# Patient Record
Sex: Male | Born: 1965 | Race: White | Hispanic: No | Smoking: Current every day smoker
Health system: Southern US, Community
[De-identification: ages and names within clinical notes are randomized; demographics above are authoritative.]

## PROBLEM LIST (undated history)

## (undated) DIAGNOSIS — Z9114 Patient's other noncompliance with medication regimen: Secondary | ICD-10-CM

## (undated) DIAGNOSIS — F419 Anxiety disorder, unspecified: Secondary | ICD-10-CM

## (undated) DIAGNOSIS — I219 Acute myocardial infarction, unspecified: Secondary | ICD-10-CM

## (undated) DIAGNOSIS — F329 Major depressive disorder, single episode, unspecified: Secondary | ICD-10-CM

## (undated) DIAGNOSIS — F1010 Alcohol abuse, uncomplicated: Secondary | ICD-10-CM

## (undated) DIAGNOSIS — F32A Depression, unspecified: Secondary | ICD-10-CM

## (undated) DIAGNOSIS — I2510 Atherosclerotic heart disease of native coronary artery without angina pectoris: Secondary | ICD-10-CM

## (undated) DIAGNOSIS — Z91148 Patient's other noncompliance with medication regimen for other reason: Secondary | ICD-10-CM

## (undated) DIAGNOSIS — I10 Essential (primary) hypertension: Secondary | ICD-10-CM

## (undated) HISTORY — PX: CHEST TUBE INSERTION: SHX231

## (undated) HISTORY — PX: EYE SURGERY: SHX253

---

## 2013-02-13 HISTORY — PX: CORONARY ANGIOPLASTY: SHX604

## 2014-01-11 ENCOUNTER — Encounter (HOSPITAL_COMMUNITY)
Admission: EM | Discharge status: Against Medical Advice | Payer: MEDICAID | Source: Home / Self Care | Attending: Cardiovascular Disease

## 2014-01-11 ENCOUNTER — Inpatient Hospital Stay (HOSPITAL_COMMUNITY)
Admission: EM | Admit: 2014-01-11 | Discharge: 2014-01-13 | DRG: 246 | Discharge status: Against Medical Advice | Payer: Medicaid Other | Attending: Cardiovascular Disease | Admitting: Cardiovascular Disease

## 2014-01-11 ENCOUNTER — Encounter (HOSPITAL_COMMUNITY): Payer: Self-pay | Admitting: *Deleted

## 2014-01-11 ENCOUNTER — Inpatient Hospital Stay (HOSPITAL_COMMUNITY): Payer: Medicaid Other

## 2014-01-11 DIAGNOSIS — R079 Chest pain, unspecified: Secondary | ICD-10-CM | POA: Diagnosis present

## 2014-01-11 DIAGNOSIS — F329 Major depressive disorder, single episode, unspecified: Secondary | ICD-10-CM | POA: Diagnosis present

## 2014-01-11 DIAGNOSIS — I25110 Atherosclerotic heart disease of native coronary artery with unstable angina pectoris: Secondary | ICD-10-CM

## 2014-01-11 DIAGNOSIS — I252 Old myocardial infarction: Secondary | ICD-10-CM

## 2014-01-11 DIAGNOSIS — T82867A Thrombosis of cardiac prosthetic devices, implants and grafts, initial encounter: Principal | ICD-10-CM | POA: Diagnosis present

## 2014-01-11 DIAGNOSIS — Z955 Presence of coronary angioplasty implant and graft: Secondary | ICD-10-CM | POA: Diagnosis not present

## 2014-01-11 DIAGNOSIS — F17210 Nicotine dependence, cigarettes, uncomplicated: Secondary | ICD-10-CM | POA: Diagnosis present

## 2014-01-11 DIAGNOSIS — I255 Ischemic cardiomyopathy: Secondary | ICD-10-CM

## 2014-01-11 DIAGNOSIS — F1290 Cannabis use, unspecified, uncomplicated: Secondary | ICD-10-CM | POA: Diagnosis present

## 2014-01-11 DIAGNOSIS — Z720 Tobacco use: Secondary | ICD-10-CM

## 2014-01-11 DIAGNOSIS — I213 ST elevation (STEMI) myocardial infarction of unspecified site: Secondary | ICD-10-CM | POA: Diagnosis present

## 2014-01-11 DIAGNOSIS — R739 Hyperglycemia, unspecified: Secondary | ICD-10-CM

## 2014-01-11 DIAGNOSIS — Z9114 Patient's other noncompliance with medication regimen: Secondary | ICD-10-CM | POA: Diagnosis present

## 2014-01-11 DIAGNOSIS — F419 Anxiety disorder, unspecified: Secondary | ICD-10-CM | POA: Diagnosis present

## 2014-01-11 DIAGNOSIS — F1010 Alcohol abuse, uncomplicated: Secondary | ICD-10-CM | POA: Diagnosis present

## 2014-01-11 DIAGNOSIS — I209 Angina pectoris, unspecified: Secondary | ICD-10-CM

## 2014-01-11 DIAGNOSIS — I10 Essential (primary) hypertension: Secondary | ICD-10-CM | POA: Diagnosis present

## 2014-01-11 DIAGNOSIS — Y838 Other surgical procedures as the cause of abnormal reaction of the patient, or of later complication, without mention of misadventure at the time of the procedure: Secondary | ICD-10-CM | POA: Diagnosis present

## 2014-01-11 DIAGNOSIS — I2119 ST elevation (STEMI) myocardial infarction involving other coronary artery of inferior wall: Secondary | ICD-10-CM | POA: Diagnosis present

## 2014-01-11 DIAGNOSIS — E785 Hyperlipidemia, unspecified: Secondary | ICD-10-CM

## 2014-01-11 DIAGNOSIS — I4519 Other right bundle-branch block: Secondary | ICD-10-CM | POA: Diagnosis present

## 2014-01-11 DIAGNOSIS — F10230 Alcohol dependence with withdrawal, uncomplicated: Secondary | ICD-10-CM

## 2014-01-11 DIAGNOSIS — I2111 ST elevation (STEMI) myocardial infarction involving right coronary artery: Secondary | ICD-10-CM

## 2014-01-11 DIAGNOSIS — I2510 Atherosclerotic heart disease of native coronary artery without angina pectoris: Secondary | ICD-10-CM

## 2014-01-11 DIAGNOSIS — F10930 Alcohol use, unspecified with withdrawal, uncomplicated: Secondary | ICD-10-CM

## 2014-01-11 HISTORY — DX: Essential (primary) hypertension: I10

## 2014-01-11 HISTORY — DX: Atherosclerotic heart disease of native coronary artery without angina pectoris: I25.10

## 2014-01-11 HISTORY — DX: Patient's other noncompliance with medication regimen: Z91.14

## 2014-01-11 HISTORY — DX: Depression, unspecified: F32.A

## 2014-01-11 HISTORY — DX: Acute myocardial infarction, unspecified: I21.9

## 2014-01-11 HISTORY — DX: Anxiety disorder, unspecified: F41.9

## 2014-01-11 HISTORY — PX: LEFT HEART CATHETERIZATION WITH CORONARY ANGIOGRAM: SHX5451

## 2014-01-11 HISTORY — DX: Patient's other noncompliance with medication regimen for other reason: Z91.148

## 2014-01-11 HISTORY — DX: Alcohol abuse, uncomplicated: F10.10

## 2014-01-11 HISTORY — DX: Major depressive disorder, single episode, unspecified: F32.9

## 2014-01-11 HISTORY — PX: CARDIAC CATHETERIZATION: SHX172

## 2014-01-11 LAB — HEMOGLOBIN A1C
Hgb A1c MFr Bld: 5.4 % (ref <5.7)
Mean Plasma Glucose: 108 mg/dL (ref <117)

## 2014-01-11 LAB — CBC WITH DIFFERENTIAL/PLATELET
Basophils Absolute: 0.1 10*3/uL (ref 0.0–0.1)
Basophils Relative: 1 % (ref 0–1)
EOS ABS: 0.3 10*3/uL (ref 0.0–0.7)
EOS PCT: 4 % (ref 0–5)
HCT: 46.3 % (ref 39.0–52.0)
HEMOGLOBIN: 15.8 g/dL (ref 13.0–17.0)
LYMPHS ABS: 1.4 10*3/uL (ref 0.7–4.0)
Lymphocytes Relative: 16 % (ref 12–46)
MCH: 31.9 pg (ref 26.0–34.0)
MCHC: 34.1 g/dL (ref 30.0–36.0)
MCV: 93.5 fL (ref 78.0–100.0)
Monocytes Absolute: 1 10*3/uL (ref 0.1–1.0)
Monocytes Relative: 11 % (ref 3–12)
Neutro Abs: 6.4 10*3/uL (ref 1.7–7.7)
Neutrophils Relative %: 68 % (ref 43–77)
PLATELETS: 686 10*3/uL — AB (ref 150–400)
RBC: 4.95 MIL/uL (ref 4.22–5.81)
RDW: 13.9 % (ref 11.5–15.5)
WBC: 9.3 10*3/uL (ref 4.0–10.5)

## 2014-01-11 LAB — CK TOTAL AND CKMB (NOT AT ARMC)
CK, MB: 2.9 ng/mL (ref 0.3–4.0)
Relative Index: 1.5 (ref 0.0–2.5)
Total CK: 194 U/L (ref 7–232)

## 2014-01-11 LAB — COMPREHENSIVE METABOLIC PANEL
ALK PHOS: 93 U/L (ref 39–117)
ALT: 81 U/L — AB (ref 0–53)
ANION GAP: 15 (ref 5–15)
AST: 77 U/L — ABNORMAL HIGH (ref 0–37)
Albumin: 4 g/dL (ref 3.5–5.2)
BILIRUBIN TOTAL: 0.3 mg/dL (ref 0.3–1.2)
BUN: 10 mg/dL (ref 6–23)
CHLORIDE: 102 meq/L (ref 96–112)
CO2: 23 mEq/L (ref 19–32)
Calcium: 8.8 mg/dL (ref 8.4–10.5)
Creatinine, Ser: 0.76 mg/dL (ref 0.50–1.35)
GFR calc non Af Amer: >90 mL/min (ref >90)
GLUCOSE: 184 mg/dL — AB (ref 70–99)
POTASSIUM: 3.9 meq/L (ref 3.7–5.3)
SODIUM: 140 meq/L (ref 137–147)
TOTAL PROTEIN: 7.3 g/dL (ref 6.0–8.3)

## 2014-01-11 LAB — RAPID URINE DRUG SCREEN, HOSP PERFORMED
Amphetamines: NOT DETECTED
Barbiturates: NOT DETECTED
Benzodiazepines: NOT DETECTED
Cocaine: NOT DETECTED
Opiates: POSITIVE — AB
TETRAHYDROCANNABINOL: NOT DETECTED

## 2014-01-11 LAB — POCT I-STAT, CHEM 8
BUN: 9 mg/dL (ref 6–23)
CHLORIDE: 103 meq/L (ref 96–112)
CREATININE: 0.7 mg/dL (ref 0.50–1.35)
Calcium, Ion: 1.24 mmol/L — ABNORMAL HIGH (ref 1.12–1.23)
GLUCOSE: 187 mg/dL — AB (ref 70–99)
HCT: 50 % (ref 39.0–52.0)
Hemoglobin: 17 g/dL (ref 13.0–17.0)
Potassium: 3.8 mEq/L (ref 3.7–5.3)
Sodium: 141 mEq/L (ref 137–147)
TCO2: 23 mmol/L (ref 0–100)

## 2014-01-11 LAB — TROPONIN I
Troponin I: <0.3 ng/mL (ref <0.30)
Troponin I: 8.09 ng/mL (ref <0.30)
Troponin I: >20 ng/mL (ref <0.30)

## 2014-01-11 LAB — POCT ACTIVATED CLOTTING TIME
Activated Clotting Time: 394 seconds
Activated Clotting Time: 123 seconds

## 2014-01-11 LAB — LIPID PANEL
CHOL/HDL RATIO: 4.1 ratio
CHOLESTEROL: 203 mg/dL — AB (ref 0–200)
HDL: 49 mg/dL (ref >39)
LDL CALC: 131 mg/dL — AB (ref 0–99)
Triglycerides: 116 mg/dL (ref <150)
VLDL: 23 mg/dL (ref 0–40)

## 2014-01-11 LAB — MRSA PCR SCREENING: MRSA BY PCR: NEGATIVE

## 2014-01-11 LAB — PROTIME-INR
INR: 1.16 (ref 0.00–1.49)
Prothrombin Time: 14.9 seconds (ref 11.6–15.2)

## 2014-01-11 LAB — APTT: APTT: 118 s — AB (ref 24–37)

## 2014-01-11 SURGERY — LEFT HEART CATHETERIZATION WITH CORONARY ANGIOGRAM
Anesthesia: LOCAL | Site: Groin | Laterality: Right

## 2014-01-11 MED ORDER — SODIUM CHLORIDE 0.9 % IV SOLN
INTRAVENOUS | Status: AC
  Administered 2014-01-11: 12:00:00 via INTRAVENOUS

## 2014-01-11 MED ORDER — LORAZEPAM 1 MG PO TABS
1.0000 mg | ORAL_TABLET | Freq: Four times a day (QID) | ORAL | Status: DC | PRN
  Administered 2014-01-12 – 2014-01-13 (×2): 1 mg via ORAL
  Filled 2014-01-11: qty 1

## 2014-01-11 MED ORDER — ACETAMINOPHEN 325 MG PO TABS: 650.0000 mg | ORAL_TABLET | ORAL | Status: DC | PRN

## 2014-01-11 MED ORDER — ASPIRIN 81 MG PO CHEW
81.0000 mg | CHEWABLE_TABLET | Freq: Every day | ORAL | Status: DC
  Administered 2014-01-12 – 2014-01-13 (×2): 81 mg via ORAL
  Filled 2014-01-11 (×2): qty 1

## 2014-01-11 MED ORDER — SODIUM CHLORIDE 0.9 % IV SOLN
1.0000 mg/kg/h | INTRAVENOUS | Status: AC
  Administered 2014-01-11: 1 mg/kg/h via INTRAVENOUS
  Filled 2014-01-11 (×3): qty 250

## 2014-01-11 MED ORDER — ONDANSETRON HCL 4 MG/2ML IJ SOLN: 4.0000 mg | Freq: Four times a day (QID) | INTRAMUSCULAR | Status: DC | PRN

## 2014-01-11 MED ORDER — LORAZEPAM 1 MG PO TABS: 0.0000 mg | ORAL_TABLET | Freq: Two times a day (BID) | ORAL | Status: DC

## 2014-01-11 MED ORDER — LORAZEPAM 1 MG PO TABS
0.0000 mg | ORAL_TABLET | Freq: Four times a day (QID) | ORAL | Status: AC
  Administered 2014-01-11 – 2014-01-12 (×3): 1 mg via ORAL
  Filled 2014-01-11 (×2): qty 1
  Filled 2014-01-11: qty 2
  Filled 2014-01-11: qty 1

## 2014-01-11 MED ORDER — MORPHINE SULFATE 2 MG/ML IJ SOLN
2.0000 mg | INTRAMUSCULAR | Status: DC | PRN
  Administered 2014-01-11 (×3): 2 mg via INTRAVENOUS
  Filled 2014-01-11 (×3): qty 1

## 2014-01-11 MED ORDER — ATORVASTATIN CALCIUM 40 MG PO TABS
40.0000 mg | ORAL_TABLET | Freq: Every day | ORAL | Status: DC
  Administered 2014-01-11 – 2014-01-12 (×2): 40 mg via ORAL
  Filled 2014-01-11 (×3): qty 1

## 2014-01-11 MED ORDER — METOPROLOL TARTRATE 12.5 MG HALF TABLET
12.5000 mg | ORAL_TABLET | Freq: Two times a day (BID) | ORAL | Status: DC
  Administered 2014-01-11 – 2014-01-13 (×5): 12.5 mg via ORAL
  Filled 2014-01-11 (×6): qty 1

## 2014-01-11 MED ORDER — THIAMINE HCL 100 MG/ML IJ SOLN
100.0000 mg | Freq: Every day | INTRAMUSCULAR | Status: DC
  Filled 2014-01-11 (×2): qty 1

## 2014-01-11 MED ORDER — FOLIC ACID 1 MG PO TABS
1.0000 mg | ORAL_TABLET | Freq: Every day | ORAL | Status: DC
  Administered 2014-01-11 – 2014-01-13 (×3): 1 mg via ORAL
  Filled 2014-01-11 (×3): qty 1

## 2014-01-11 MED ORDER — ADULT MULTIVITAMIN W/MINERALS CH
1.0000 | ORAL_TABLET | Freq: Every day | ORAL | Status: DC
  Administered 2014-01-11 – 2014-01-13 (×3): 1 via ORAL
  Filled 2014-01-11 (×3): qty 1

## 2014-01-11 MED ORDER — TIROFIBAN HCL IV 5 MG/100ML
0.1500 ug/kg/min | INTRAVENOUS | Status: AC
  Administered 2014-01-11 – 2014-01-12 (×3): 0.15 ug/kg/min via INTRAVENOUS
  Filled 2014-01-11 (×6): qty 100

## 2014-01-11 MED ORDER — CLOPIDOGREL BISULFATE 75 MG PO TABS
75.0000 mg | ORAL_TABLET | Freq: Every day | ORAL | Status: DC
  Administered 2014-01-12 – 2014-01-13 (×2): 75 mg via ORAL
  Filled 2014-01-11 (×3): qty 1

## 2014-01-11 MED ORDER — VITAMIN B-1 100 MG PO TABS
100.0000 mg | ORAL_TABLET | Freq: Every day | ORAL | Status: DC
  Administered 2014-01-11 – 2014-01-13 (×3): 100 mg via ORAL
  Filled 2014-01-11 (×3): qty 1

## 2014-01-11 MED ORDER — ATROPINE SULFATE 0.1 MG/ML IJ SOLN
INTRAMUSCULAR | Status: AC
  Filled 2014-01-11: qty 10

## 2014-01-11 MED ORDER — SODIUM CHLORIDE 0.9 % IV SOLN
1.7500 mg/kg/h | INTRAVENOUS | Status: DC
  Filled 2014-01-11 (×3): qty 250

## 2014-01-11 MED ORDER — LORAZEPAM 2 MG/ML IJ SOLN
1.0000 mg | Freq: Four times a day (QID) | INTRAMUSCULAR | Status: DC | PRN
  Administered 2014-01-12: 1 mg via INTRAVENOUS
  Filled 2014-01-11: qty 1
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
## 2014-01-11 NOTE — Care Management Note (Addendum)
    Page 1 of 1   01/11/2014     2:59:39 PM CARE MANAGEMENT NOTE 01/11/2014  Patient:  RUE, TINNEL   Account Number:  [id number]  Date Initiated:  01/11/2014  Documentation initiated by:  Elissa Hefty  Subjective/Objective Assessment:   adm w mi     Action/Plan:   lives w fam   Anticipated DC Date:     Anticipated DC Plan:        [place]  CM consult  [facility]      Choice offered to / List presented to:             Status of service:   Medicare Important Message given?   (If response is "NO", the following Medicare IM given date fields will be blank) Date Medicare IM given:   Medicare IM given by:   Date Additional Medicare IM given:   Additional Medicare IM given by:    Discharge Disposition:    Per UR Regulation:  Reviewed for med. necessity/level of care/duration of stay  If discussed at [place] of Stay Meetings, dates discussed:    Comments:  10/22 1457 debbie Dannie Hattabaugh rn,bsn spoke w pt and gave him inform on [facility] in [place]. pt states he knows of clinic but feels copay and meds there would be too high. gave pt 2 prescription discount cards but pt states he has tried this before w no success. md may want to get on as many 4.00 meds as possible. did leave inform for pt.

## 2014-01-11 NOTE — Progress Notes (Signed)
01/11/2014 1800  Rt arterial sheath removed. Pressure held x 25 minutes. Site remains same as before removal. Groin site level 2. Pt remained stable.  Instructions for groin site given. Freq reminders given.  Ayn Domangue, Carolynn Comment

## 2014-01-11 NOTE — Progress Notes (Signed)
01/11/2014 2:18 PM Trop 8.09 given to [employer] PA. Tyneshia Stivers, Carolynn Comment

## 2014-01-11 NOTE — Progress Notes (Signed)
1400  Rt groin site oozing around sheath. Dssg changed. Rt groin hard and swollen. Charge nurse called to access along with this nurse. Pressure held around site. Instructed again not to raise head off of bed or to bend at groin. Cath lab called. Lenon Kuennen, Carolynn Comment

## 2014-01-11 NOTE — Progress Notes (Signed)
Chaplain responded to code stemi page. No family present. Please page chaplain if needed. Will follow as needed.  Joshua Bird 01/11/2014 Now

## 2014-01-11 NOTE — CV Procedure (Signed)
Joshua Bird is a 48 y.o. male    [id number] LOCATION:  FACILITY: [facility]  PHYSICIAN: Quay Burow, M.D. 1965/06/10   DATE OF PROCEDURE:  01/11/2014  DATE OF DISCHARGE:     CARDIAC CATHETERIZATION     History obtained from chart review.Joshua Bird is a 48 year old Caucasian male with history of CAD status post stenting in [place] one year ago. He has a history of tobacco abuse and medication noncompliance because of fiscal constraints. He stopped taking his medications approximately 6 months ago.. Acute onset of substernal chest pain an approximate 7:00 this morning and was brought by EMS to [name] room where he was noted to have inferior ST segment elevation. He was brought emergently to the cath lab for angiography and intervention.   PROCEDURE DESCRIPTION:   The patient was brought to the second floor [facility] cath lab in the postabsorptive state. He was premedicated with Valium 5 mg by mouth, IV Versed and fentanyl. His right groinwas prepped and shaved in usual sterile fashion. Xylocaine 1% was used for local anesthesia. A 6 French sheath was inserted into the right common femoral artery using standard Seldinger technique. 6 French right and left Judkins diagnostic catheters as well as a 6 French pigtail catheter were used for selective coronary angiography and left ventriculography respectively. Visipaque dye was used for the entirety of the case. Retrograde aortic, left ventricular and pullback pressures were recorded.   HEMODYNAMICS:    AO SYSTOLIC/AO DIASTOLIC: 616/07   LV SYSTOLIC/LV DIASTOLIC: 371/06  ANGIOGRAPHIC RESULTS:   1. Left main; normal  2. LAD; normal 3. Left circumflex; nondominant and normal. There was a moderately large ramus branch that was significant disease.  4. Right coronary artery; large dominant vessel that was the infarct-related artery. There was thrombus in the proximal and mid stented segment followed by total  occlusion at the genu. There was grade 1 left right collaterals. 5. Left ventriculography; RAO left ventriculogram was performed using  25 mL of Visipaque dye at 12 mL/second. The overall LVEF estimated  40-45 %  With wall motion abnormalities mild to moderate distal anteroapical and inferoapical hypokinesia.  IMPRESSION:Joshua Bird has an occluded dominant RCA with an internist and he is a large thrombus burden. We will proceed with PCI and stenting of aspiration thrombectomy.  Procedure description: The patient received Angiomax bolus and ACT of 394. He received Aggrastat bolus and infusion as well. He was 600 mg of by mouth Plavix. A total of 200 cc of contrast was administered during the case. Retrograde aortic pressure was monitored during the case. A 6 [place] JR 4 guide catheter along with an 014/190 cm long guidewire and a 2 mm x 12 mm long Trek balloon PTCA was performed.  Following this aspiration thrombectomy was performed daily with a Fetch 2 aspiration catheter.multiple passes were performed removing a copious amount of thrombotic material. Following this balloon dilatation was performed with a 3 mm x 20 mm long balloon establishing antegrade flow .The DTBT  time was 22 minutes. I then performed IVUS revealing a proximal and distal reference segment measuring approximately 6 mm. Following this I deployed a 5 mm x 24 mm long bare-metal stent across the stenotic area and deployed at 16 atmospheres. I post dilated with a 6 mm x 15 mm balloon. The final angiographic result introduction of a total occlusion to 0% residual with TIMI-3 flow. Because of filling defect in the mid stented segment despite multiple passes with an aspiration catheter  I elected to stent the mid RCA with a 3.5 millimeter by 23 mm long Penta stent at 17 atmospheres (3.95 mm) resulting in reduction of the visible thrombus with excellent flow.   Final impression: Successful PCI and stenting of a thrombotic lesion occluded large  dominant RCA with bare-metal stents, aspirin, Plavix, Angiomax and Aggrastat. I performed aspiration thrombectomy as well as intravascular ultrasound. The patient did have moderate LV dysfunction. Aggressive medical therapy will be initiated and social service consult will be obtained to ensure medication availability and compliance. The patient left the lab in stable condition. He'll probably be stable for discharge in 48-72 hours.   Lorretta Harp MD, [facility] 01/11/2014 11:12 AM

## 2014-01-12 DIAGNOSIS — I2111 ST elevation (STEMI) myocardial infarction involving right coronary artery: Secondary | ICD-10-CM

## 2014-01-12 DIAGNOSIS — F10230 Alcohol dependence with withdrawal, uncomplicated: Secondary | ICD-10-CM

## 2014-01-12 DIAGNOSIS — F1010 Alcohol abuse, uncomplicated: Secondary | ICD-10-CM

## 2014-01-12 DIAGNOSIS — I10 Essential (primary) hypertension: Secondary | ICD-10-CM

## 2014-01-12 DIAGNOSIS — Z720 Tobacco use: Secondary | ICD-10-CM

## 2014-01-12 DIAGNOSIS — I25110 Atherosclerotic heart disease of native coronary artery with unstable angina pectoris: Secondary | ICD-10-CM

## 2014-01-12 DIAGNOSIS — I209 Angina pectoris, unspecified: Secondary | ICD-10-CM

## 2014-01-12 LAB — HEPATIC FUNCTION PANEL
ALK PHOS: 92 U/L (ref 39–117)
ALT: 83 U/L — AB (ref 0–53)
AST: 167 U/L — AB (ref 0–37)
Albumin: 3.9 g/dL (ref 3.5–5.2)
Bilirubin, Direct: <0.2 mg/dL (ref 0.0–0.3)
TOTAL PROTEIN: 7.1 g/dL (ref 6.0–8.3)
Total Bilirubin: 0.7 mg/dL (ref 0.3–1.2)

## 2014-01-12 LAB — TROPONIN I: Troponin I: >20 ng/mL (ref <0.30)

## 2014-01-12 LAB — BASIC METABOLIC PANEL
Anion gap: 15 (ref 5–15)
BUN: 8 mg/dL (ref 6–23)
CHLORIDE: 98 meq/L (ref 96–112)
CO2: 24 mEq/L (ref 19–32)
Calcium: 9.1 mg/dL (ref 8.4–10.5)
Creatinine, Ser: 0.78 mg/dL (ref 0.50–1.35)
GFR calc Af Amer: >90 mL/min (ref >90)
GLUCOSE: 102 mg/dL — AB (ref 70–99)
POTASSIUM: 4 meq/L (ref 3.7–5.3)
SODIUM: 137 meq/L (ref 137–147)

## 2014-01-12 LAB — CBC
HCT: 45.6 % (ref 39.0–52.0)
HEMOGLOBIN: 15.2 g/dL (ref 13.0–17.0)
MCH: 31.4 pg (ref 26.0–34.0)
MCHC: 33.3 g/dL (ref 30.0–36.0)
MCV: 94.2 fL (ref 78.0–100.0)
Platelets: 606 10*3/uL — ABNORMAL HIGH (ref 150–400)
RBC: 4.84 MIL/uL (ref 4.22–5.81)
RDW: 13.8 % (ref 11.5–15.5)
WBC: 11.3 10*3/uL — AB (ref 4.0–10.5)

## 2014-01-12 MED ORDER — HYDROCODONE-ACETAMINOPHEN 5-325 MG PO TABS
1.0000 | ORAL_TABLET | ORAL | Status: DC | PRN
  Administered 2014-01-12 – 2014-01-13 (×2): 1 via ORAL
  Filled 2014-01-12 (×2): qty 1

## 2014-01-12 MED FILL — Heparin Sodium (Porcine) 2 Unit/ML in Sodium Chloride 0.9%: INTRAMUSCULAR | Qty: 1000 | Status: AC

## 2014-01-12 MED FILL — Nitroglycerin IV Soln 100 MCG/ML in D5W: INTRA_ARTERIAL | Qty: 10 | Status: AC

## 2014-01-12 MED FILL — Sodium Chloride IV Soln 0.9%: INTRAVENOUS | Qty: 50 | Status: AC

## 2014-01-12 MED FILL — Bivalirudin For IV Soln 250 MG: INTRAVENOUS | Qty: 250 | Status: AC

## 2014-01-12 MED FILL — Ondansetron HCl Inj 4 MG/2ML (2 MG/ML): INTRAMUSCULAR | Qty: 2 | Status: AC

## 2014-01-12 MED FILL — Heparin Sodium (Porcine) Inj 1000 Unit/ML: INTRAMUSCULAR | Qty: 10 | Status: AC

## 2014-01-12 MED FILL — Lidocaine HCl Local Preservative Free (PF) Inj 1%: INTRAMUSCULAR | Qty: 30 | Status: AC

## 2014-01-12 MED FILL — Tirofiban HCl in NaCl IV Soln 25 MG/500ML (Base Equiv): INTRAVENOUS | Qty: 100 | Status: AC

## 2014-01-12 NOTE — Progress Notes (Signed)
PROGRESS NOTE  Subjective:   48 yo male, hx of ETOH abuse, HTN Stopped taking plavix   Objective:    Vital Signs:   Temp:  [97.3 F (36.3 C)-98.6 F (37 C)] 98 F (36.7 C) (10/23 0700) Pulse Rate:  [57-94] 94 (10/23 0900) Resp:  [12-27] 22 (10/23 0900) BP: (114-158)/(73-112) 136/91 mmHg (10/23 0700) SpO2:  [88 %-99 %] 93 % (10/23 0900) Weight:  [188 lb 7.9 oz (85.5 kg)-200 lb 9.9 oz (91 kg)] 188 lb 7.9 oz (85.5 kg) (10/22 1200)  Last BM Date: 01/11/14   24-hour weight change: Weight change:   Weight trends: Filed Weights   01/11/14 1024 01/11/14 1138 01/11/14 1200  Weight: 200 lb 9.9 oz (91 kg) 189 lb 2.5 oz (85.8 kg) 188 lb 7.9 oz (85.5 kg)    Intake/Output:  10/22 0701 - 10/23 0700 In: 2476.8 [P.O.:1080; I.V.:1396.8] Out: 2725 [Urine:2725] Total I/O In: 240 [P.O.:240] Out: -    Physical Exam: BP 136/91  Pulse 94  Temp(Src) 98 F (36.7 C) (Oral)  Resp 22  Ht 5\' 6"  (1.676 m)  Wt 188 lb 7.9 oz (85.5 kg)  BMI 30.44 kg/m2  SpO2 93%  Wt Readings from Last 3 Encounters:  01/11/14 188 lb 7.9 oz (85.5 kg)  01/11/14 188 lb 7.9 oz (85.5 kg)    General: Vital signs reviewed and noted.   Head: Normocephalic, atraumatic.  Eyes: conjunctivae/corneas clear.  EOM's intact.   Throat: normal  Neck:  normal   Lungs:    clear   Heart:  RR  Abdomen:  Soft, non-tender, non-distended    Extremities: r femoral cath site looks ok.  Mild echymosis, no hematoma    Neurologic: A&O X3, CN II - XII are grossly intact.   Psych: Normal     Labs: BMET:  Recent Labs  01/11/14 0944 01/11/14 0946 01/12/14 0220  NA 140 141 137  K 3.9 3.8 4.0  CL 102 103 98  CO2 23  --  24  GLUCOSE 184* 187* 102*  BUN 10 9 8   CREATININE 0.76 0.70 0.78  CALCIUM 8.8  --  9.1    Liver function tests:  Recent Labs  01/11/14 0944 01/12/14 0220  AST 77* 167*  ALT 81* 83*  ALKPHOS 93 92  BILITOT 0.3 0.7  PROT 7.3 7.1  ALBUMIN 4.0 3.9   No results found for this  basename: LIPASE, AMYLASE,  in the last 72 hours  CBC:  Recent Labs  01/11/14 0944 01/11/14 0946 01/12/14 0220  WBC 9.3  --  11.3*  NEUTROABS 6.4  --   --   HGB 15.8 17.0 15.2  HCT 46.3 50.0 45.6  MCV 93.5  --  94.2  PLT 686*  --  606*    Cardiac Enzymes:  Recent Labs  01/11/14 0944 01/11/14 1129 01/11/14 1600 01/12/14 0656  CKTOTAL 194  --   --   --   CKMB 2.9  --   --   --   TROPONINI <0.30 8.09* >20.00* >20.00*    Coagulation Studies:  Recent Labs  01/11/14 0944  LABPROT 14.9  INR 1.16    Other: No components found with this basename: POCBNP,  No results found for this basename: DDIMER,  in the last 72 hours  Recent Labs  01/11/14 0944  HGBA1C 5.4    Recent Labs  01/11/14 0944  CHOL 203*  HDL 49  LDLCALC 131*  TRIG 116  CHOLHDL 4.1   No results  found for this basename: TSH, T4TOTAL, FREET3, T3FREE, THYROIDAB,  in the last 72 hours No results found for this basename: VITAMINB12, FOLATE, FERRITIN, TIBC, IRON, RETICCTPCT,  in the last 72 hours   Other results:  EKG :   NSR,  Persistent ST elevation inferior.   Medications:    Infusions:    Scheduled Medications: . aspirin  81 mg Oral Daily  . atorvastatin  40 mg Oral q1800  . clopidogrel  75 mg Oral Q breakfast  . folic acid  1 mg Oral Daily  . LORazepam  0-4 mg Oral Q6H   Followed by  . [START ON 01/13/2014] LORazepam  0-4 mg Oral Q12H  . metoprolol tartrate  12.5 mg Oral BID  . multivitamin with minerals  1 tablet Oral Daily  . thiamine  100 mg Oral Daily   Or  . thiamine  100 mg Intravenous Daily    Assessment/ Plan:      STEMI (ST elevation myocardial infarction) Due to thrombosis of his RCA stent.  He had stopped his plavix Advised him on the importance of taking that medication and all medication.s    CAD (coronary artery disease)    HTN (hypertension) - BP is stable     Hyperlipidemia    Tobacco abuse - advised him to stop smoking    Alcohol abuse - on the CIWA  protocol     Cardiomyopathy, ischemic    Hyperglycemia  Transfer to tele  Disposition:  Length of Stay: 1  Thayer Headings, Brooke Bonito., MD, [facility] 01/12/2014, 9:14 AM Office [phone] Pager [phone]

## 2014-01-12 NOTE — Progress Notes (Signed)
Pt c/o groin pain, he has not been compliant with leg restriction. Hematoma increased in size when re-assessed, aprox 2cm. Pressure held for 15 minutes and hematoma massaged. Re-educated with pt the importance of mobility restriction and bedrest extended until the am.

## 2014-01-12 NOTE — Progress Notes (Signed)
CARDIAC REHAB PHASE I   PRE:  Rate/Rhythm: 84 SR    BP: sitting 101/74    SaO2: 97 RA  MODE:  Ambulation: 450 ft   POST:  Rate/Rhythm: 97 SR    BP: sitting 110/74     SaO2: 97 RA  Pt very difficult to wake up. Reluctant to walk. Unsteady getting up and walking. He sts this is normal for him due to ankles, knees, back and hip pain. Also somewhat SOB which he sts is his norm. Pt sts he has an appt tomorrow afternoon with social services and has waited for this appt 3 months. Wants to make it to it. Did ed, some receptiveness but also watching tv. Pt sts he is not sure he wants to quit smoking. Gave resources. Declined ex gl.  [place], [place], ACSM 01/12/2014 3:39 PM

## 2014-01-12 NOTE — Progress Notes (Signed)
EKG CRITICAL VALUE     12 lead EKG performed.  Critical value noted.  Chauncey Reading, RN notified.   Sahil Milner C, CCT 01/12/2014 7:06 AM

## 2014-01-13 MED ORDER — CLOPIDOGREL BISULFATE 75 MG PO TABS: 75.0000 mg | ORAL_TABLET | Freq: Every day | ORAL | Status: DC

## 2014-01-13 MED ORDER — METOPROLOL TARTRATE 25 MG PO TABS: 12.5000 mg | ORAL_TABLET | Freq: Two times a day (BID) | ORAL | Status: DC

## 2014-01-13 MED ORDER — THIAMINE HCL 100 MG PO TABS: 100.0000 mg | ORAL_TABLET | Freq: Every day | ORAL | Status: DC

## 2014-01-13 MED ORDER — ASPIRIN 81 MG PO CHEW
81.0000 mg | CHEWABLE_TABLET | Freq: Every day | ORAL | Status: DC
Start: 2014-01-13 — End: 2015-08-09

## 2014-01-13 MED ORDER — FOLIC ACID 1 MG PO TABS: 1.0000 mg | ORAL_TABLET | Freq: Every day | ORAL | Status: DC

## 2014-01-13 MED ORDER — ATORVASTATIN CALCIUM 40 MG PO TABS: 40.0000 mg | ORAL_TABLET | Freq: Every day | ORAL | Status: DC

## 2014-01-13 MED ORDER — ADULT MULTIVITAMIN W/MINERALS CH: 1.0000 | ORAL_TABLET | Freq: Every day | ORAL | Status: DC

## 2014-01-13 NOTE — Progress Notes (Signed)
I was called by the nurse stating the patient left AMA.  I will complete medical reconciliation.  Alroy Portela, PAC

## 2014-01-13 NOTE — Progress Notes (Signed)
CARDIAC REHAB PHASE I   PRE:  Rate/Rhythm:   BP:  Supine:   Sitting: 96/54  Standing:    SaO2: 94% RA  MODE:  Ambulation: 550 ft   POST:  Rate/Rhythm: 81  BP:  Supine:   Sitting: 90/62  Standing:    SaO2: 96% RA  [id number] Patient tolerated ambulation well, walking independently, gait fast, steady, mild SOB, orthopedic pain: hips, back, groin with ambulation. Pt denies CP. Blood pressure low before and after ambulation, asymptomatic. To chair after walk.  Sol Passer, MS, ACSM CCEP

## 2014-01-13 NOTE — Discharge Summary (Signed)
Physician Discharge Summary     Cardiologist: Gwenlyn Found Patient ID: Joshua Bird MRN: [id number] DOB/AGE: 1965/10/29 48 y.o.  Admit date: 01/11/2014 Discharge date: 01/13/2014  Admission Diagnoses: STEMI  Discharge Diagnoses:  Principal Problem:   STEMI (ST elevation myocardial infarction) Active Problems:   CAD (coronary artery disease)   HTN (hypertension)   Hyperlipidemia   Tobacco abuse   Alcohol abuse   Cardiomyopathy, ischemic   Hyperglycemia   Discharged Condition: The patient was feeling better the day he left [place] Hospital Course:   Joshua Bird is a 48 y/o M with ongoing tobacco abuse and alcohol abuse, HTN, and reported history of CAD s/p stenting in [place] in 2014 who presented to [facility] today with chest pain. He has history of medication noncompliance because of fiscal constraints. He stopped taking his medications approximately 6 months ago. He developed acute onset of substernal chest pain an approximate 7:00 this morning and was brought by EMS. He was noted to have inferior ST segment elevation and code STEMI was called.   The patient was brought emergently to the cath lab for angiography and intervention. He was found to have a thrombotic lesion occluded large dominant RCA which was treated with successful PCI, thrombectomy and bare metal stenting. LVEF 40-45%. Initial troponin negative then 8.09. Other pertinent labs include glucose 184, mildly elevated AST/ALT 77/81, LDL 131 and platelets 686.  Patient was advised on multiple occasions regarding the importance of taking his medications. He was also counseled on smoking cessation. He was transferred to telemetry floor on October 23.  He was seen by cardiac rehabilitation on October 23.  He was unsteady on his feet when getting up but ambulated 450 feet. The patient stated that this was normal for him to do ankles knees and back pain.  The patient was seen by Dr. Wynonia Lawman during rounds on October 24  and was feeling better at that time. Dr. Wynonia Lawman noted the patient had very poor insight into his overall medical condition.  Sometime after 12 noon on October 24 the patient removed all IVs got dressed and left the hospital [place].  Medical reconciliation was completed prescriptions were sent to [facility] in [place].    Consults: Cardiac rehabilitation  Significant Diagnostic Studies:  CARDIAC CATHETERIZATION  History obtained from chart review.Joshua Bird is a 48 year old Caucasian male with history of CAD status post stenting in [place] one year ago. He has a history of tobacco abuse and medication noncompliance because of fiscal constraints. He stopped taking his medications approximately 6 months ago.. Acute onset of substernal chest pain an approximate 7:00 this morning and was brought by EMS to [facility] emergency room where he was noted to have inferior ST segment elevation. He was brought emergently to the cath lab for angiography and intervention.  PROCEDURE DESCRIPTION:  The patient was brought to the second floor [facility] cath lab in the postabsorptive state. He was premedicated with Valium 5 mg by mouth, IV Versed and fentanyl. His right groinwas prepped and shaved in usual sterile fashion. Xylocaine 1% was used for local anesthesia. A 6 French sheath was inserted into the right common femoral artery using standard Seldinger technique. 6 French right and left Judkins diagnostic catheters as well as a 6 French pigtail catheter were used for selective coronary angiography and left ventriculography respectively. Visipaque dye was used for the entirety of the case. Retrograde aortic, left ventricular and pullback pressures were recorded.  HEMODYNAMICS:  AO SYSTOLIC/AO DIASTOLIC: 627/03  LV SYSTOLIC/LV DIASTOLIC: 500/93  ANGIOGRAPHIC RESULTS:  1. Left main; normal  2. LAD; normal  3. Left circumflex; nondominant and normal. There was a moderately large  ramus branch that was significant disease.  4. Right coronary artery; large dominant vessel that was the infarct-related artery. There was thrombus in the proximal and mid stented segment followed by total occlusion at the genu. There was grade 1 left right collaterals.  5. Left ventriculography; RAO left ventriculogram was performed using  25 mL of Visipaque dye at 12 mL/second. The overall LVEF estimated  40-45 % With wall motion abnormalities mild to moderate distal anteroapical and inferoapical hypokinesia.  IMPRESSION:Joshua Bird has an occluded dominant RCAand he has a large thrombus burden. We will proceed with PCI and stenting of aspiration thrombectomy.  Procedure description: The patient received Angiomax bolus and ACT of 394. He received Aggrastat bolus and infusion as well. He was 600 mg of by mouth Plavix. A total of 200 cc of contrast was administered during the case. Retrograde aortic pressure was monitored during the case. A 6 [place] JR 4 guide catheter along with an 014/190 cm long guidewire and a 2 mm x 12 mm long Trek balloon PTCA was performed. Following this aspiration thrombectomy was performed daily with a Fetch 2 aspiration catheter.multiple passes were performed removing a copious amount of thrombotic material. Following this balloon dilatation was performed with a 3 mm x 20 mm long balloon establishing antegrade flow .The DTBT time was 22 minutes. I then performed IVUS revealing a proximal and distal reference segment measuring approximately 6 mm. Following this I deployed a 5 mm x 24 mm long bare-metal stent across the stenotic area and deployed at 16 atmospheres. I post dilated with a 6 mm x 15 mm balloon. The final angiographic result introduction of a total occlusion to 0% residual with TIMI-3 flow. Because of filling defect in the mid stented segment despite multiple passes with an aspiration catheter I elected to stent the mid RCA with a 3.5 millimeter by 23 mm long Penta stent  at 17 atmospheres (3.95 mm) resulting in reduction of the visible thrombus with excellent flow.   Final impression: Successful PCI and stenting of a thrombotic lesion occluded large dominant RCA with bare-metal stents, aspirin, Plavix, Angiomax and Aggrastat. I performed aspiration thrombectomy as well as intravascular ultrasound. The patient did have moderate LV dysfunction. Aggressive medical therapy will be initiated and social service consult will be obtained to ensure medication availability and compliance. The patient left the lab in stable condition. He'll probably be stable for discharge in 48-72 hours.  Lorretta Harp MD, [facility]  01/11/2014  Lipid Panel     Component Value Date/Time   CHOL 203* 01/11/2014 0944   TRIG 116 01/11/2014 0944   HDL 49 01/11/2014 0944   CHOLHDL 4.1 01/11/2014 0944   VLDL 23 01/11/2014 0944   LDLCALC 131* 01/11/2014 0944     Treatments: See above  Discharge Exam: Blood pressure 100/64, pulse 78, temperature 98.4 F (36.9 C), temperature source Oral, resp. rate 18, height 5\' 6"  (1.676 m), weight 194 lb 12.8 oz (88.361 kg), SpO2 92.00%.   Disposition: 01-Home or Self Care     Medication List         aspirin 81 MG chewable tablet  Chew 1 tablet (81 mg total) by mouth daily.     atorvastatin 40 MG tablet  Commonly known as:  LIPITOR  Take 1 tablet (40 mg total) by  mouth daily at 6 PM.     clopidogrel 75 MG tablet  Commonly known as:  PLAVIX  Take 1 tablet (75 mg total) by mouth daily with breakfast.     folic acid 1 MG tablet  Commonly known as:  FOLVITE  Take 1 tablet (1 mg total) by mouth daily.     metoprolol tartrate 25 MG tablet  Commonly known as:  LOPRESSOR  Take 0.5 tablets (12.5 mg total) by mouth 2 (two) times daily.     multivitamin with minerals Tabs tablet  Take 1 tablet by mouth daily.     thiamine 100 MG tablet  Take 1 tablet (100 mg total) by mouth daily.        Greater than 30 minutes was spent completing the  patient's discharge.    SignedTarri Fuller, [place] 01/13/2014, 4:02 PM  Agree with above, seen earlier on rounds too.  Kerry Hough MD [facility]

## 2014-01-13 NOTE — Progress Notes (Signed)
Subjective:  Admitted with stent thrombosis due to failure to take medicine. He is feeling better today but has very much difficulty walking. He had occasional right-sided chest pain. Very poor insight into his overall medical condition.  Objective:  Vital Signs in the last 24 hours: BP 109/76  Pulse 96  Temp(Src) 97.5 F (36.4 C) (Oral)  Resp 18  Ht 5\' 6"  (1.676 m)  Wt 88.361 kg (194 lb 12.8 oz)  BMI 31.46 kg/m2  SpO2 97%  Physical Exam: Middle-aged white male in no acute distress Lungs:  Clear Cardiac:  Regular rhythm, normal S1 and S2, no S3 Extremities:  Cath site clean and dry Abnormal gait  Intake/Output from previous day: 10/23 0701 - 10/24 0700 In: 240 [P.O.:240] Out: -   Weight Filed Weights   01/11/14 1138 01/11/14 1200 01/13/14 0520  Weight: 85.8 kg (189 lb 2.5 oz) 85.5 kg (188 lb 7.9 oz) 88.361 kg (194 lb 12.8 oz)    Lab Results: Basic Metabolic Panel:  Recent Labs  01/11/14 0944 01/11/14 0946 01/12/14 0220  NA 140 141 137  K 3.9 3.8 4.0  CL 102 103 98  CO2 23  --  24  GLUCOSE 184* 187* 102*  BUN 10 9 8   CREATININE 0.76 0.70 0.78   CBC:  Recent Labs  01/11/14 0944 01/11/14 0946 01/12/14 0220  WBC 9.3  --  11.3*  NEUTROABS 6.4  --   --   HGB 15.8 17.0 15.2  HCT 46.3 50.0 45.6  MCV 93.5  --  94.2  PLT 686*  --  606*   Cardiac Enzymes:  Recent Labs  01/11/14 0944 01/11/14 1129 01/11/14 1600 01/12/14 0656  CKTOTAL 194  --   --   --   CKMB 2.9  --   --   --   TROPONINI <0.30 8.09* >20.00* >20.00*    Telemetry: Sinus rhythm  Assessment/Plan:  1. Acute inferior infarction initial episode due to stent thrombosis 2. Medical noncompliance 3. Tobacco abuse advised to stop smoking  Recommendations:  Keep in the hospital today and continue to ambulate watch chest pain. Discussed importance of compliance with medical issues and medicines. On discharge will be important to have generic medication so he can afford them. Social services  consult     W. Doristine Church  MD [facility]  01/13/2014, 11:52 AM

## 2014-01-18 ENCOUNTER — Emergency Department (HOSPITAL_COMMUNITY): Payer: Medicaid Other

## 2014-01-18 ENCOUNTER — Emergency Department (HOSPITAL_COMMUNITY)
Admission: EM | Admit: 2014-01-18 | Discharge: 2014-01-18 | Disposition: A | Discharge status: Home / Self Care | Payer: Medicaid Other | Attending: Emergency Medicine | Admitting: Emergency Medicine

## 2014-01-18 ENCOUNTER — Encounter (HOSPITAL_COMMUNITY): Payer: Self-pay | Admitting: Emergency Medicine

## 2014-01-18 DIAGNOSIS — X58XXXD Exposure to other specified factors, subsequent encounter: Secondary | ICD-10-CM | POA: Insufficient documentation

## 2014-01-18 DIAGNOSIS — F419 Anxiety disorder, unspecified: Secondary | ICD-10-CM | POA: Diagnosis not present

## 2014-01-18 DIAGNOSIS — R079 Chest pain, unspecified: Secondary | ICD-10-CM

## 2014-01-18 DIAGNOSIS — I10 Essential (primary) hypertension: Secondary | ICD-10-CM | POA: Diagnosis not present

## 2014-01-18 DIAGNOSIS — Z7902 Long term (current) use of antithrombotics/antiplatelets: Secondary | ICD-10-CM | POA: Insufficient documentation

## 2014-01-18 DIAGNOSIS — I252 Old myocardial infarction: Secondary | ICD-10-CM | POA: Diagnosis not present

## 2014-01-18 DIAGNOSIS — M79606 Pain in leg, unspecified: Secondary | ICD-10-CM | POA: Diagnosis not present

## 2014-01-18 DIAGNOSIS — R52 Pain, unspecified: Secondary | ICD-10-CM

## 2014-01-18 DIAGNOSIS — Z720 Tobacco use: Secondary | ICD-10-CM | POA: Diagnosis present

## 2014-01-18 DIAGNOSIS — S3012XA Contusion of groin, initial encounter: Secondary | ICD-10-CM

## 2014-01-18 DIAGNOSIS — Z9861 Coronary angioplasty status: Secondary | ICD-10-CM | POA: Diagnosis not present

## 2014-01-18 DIAGNOSIS — I2510 Atherosclerotic heart disease of native coronary artery without angina pectoris: Secondary | ICD-10-CM | POA: Diagnosis present

## 2014-01-18 DIAGNOSIS — Z7982 Long term (current) use of aspirin: Secondary | ICD-10-CM | POA: Diagnosis not present

## 2014-01-18 DIAGNOSIS — E785 Hyperlipidemia, unspecified: Secondary | ICD-10-CM

## 2014-01-18 DIAGNOSIS — S301XXA Contusion of abdominal wall, initial encounter: Secondary | ICD-10-CM

## 2014-01-18 DIAGNOSIS — I2583 Coronary atherosclerosis due to lipid rich plaque: Secondary | ICD-10-CM

## 2014-01-18 DIAGNOSIS — S301XXD Contusion of abdominal wall, subsequent encounter: Secondary | ICD-10-CM | POA: Insufficient documentation

## 2014-01-18 DIAGNOSIS — Z79899 Other long term (current) drug therapy: Secondary | ICD-10-CM | POA: Insufficient documentation

## 2014-01-18 DIAGNOSIS — Z9119 Patient's noncompliance with other medical treatment and regimen: Secondary | ICD-10-CM | POA: Insufficient documentation

## 2014-01-18 LAB — BASIC METABOLIC PANEL
Anion gap: 13 (ref 5–15)
BUN: 7 mg/dL (ref 6–23)
CO2: 24 mEq/L (ref 19–32)
CREATININE: 0.74 mg/dL (ref 0.50–1.35)
Calcium: 9 mg/dL (ref 8.4–10.5)
Chloride: 104 mEq/L (ref 96–112)
GFR calc non Af Amer: >90 mL/min (ref >90)
Glucose, Bld: 94 mg/dL (ref 70–99)
Potassium: 4.4 mEq/L (ref 3.7–5.3)
Sodium: 141 mEq/L (ref 137–147)

## 2014-01-18 LAB — CBC
HCT: 45 % (ref 39.0–52.0)
Hemoglobin: 15.3 g/dL (ref 13.0–17.0)
MCH: 31.9 pg (ref 26.0–34.0)
MCHC: 34 g/dL (ref 30.0–36.0)
MCV: 93.9 fL (ref 78.0–100.0)
Platelets: 732 10*3/uL — ABNORMAL HIGH (ref 150–400)
RBC: 4.79 MIL/uL (ref 4.22–5.81)
RDW: 13.6 % (ref 11.5–15.5)
WBC: 9.3 10*3/uL (ref 4.0–10.5)

## 2014-01-18 LAB — CK TOTAL AND CKMB (NOT AT ARMC)
CK TOTAL: 80 U/L (ref 7–232)
CK, MB: 1.9 ng/mL (ref 0.3–4.0)
RELATIVE INDEX: INVALID (ref 0.0–2.5)

## 2014-01-18 LAB — PROTIME-INR
INR: 1.02 (ref 0.00–1.49)
PROTHROMBIN TIME: 13.5 s (ref 11.6–15.2)

## 2014-01-18 LAB — PRO B NATRIURETIC PEPTIDE: Pro B Natriuretic peptide (BNP): 470.6 pg/mL — ABNORMAL HIGH (ref 0–125)

## 2014-01-18 LAB — TROPONIN I
Troponin I: 0.61 ng/mL (ref <0.30)
Troponin I: 0.35 ng/mL (ref <0.30)

## 2014-01-18 MED ORDER — MORPHINE SULFATE 4 MG/ML IJ SOLN
4.0000 mg | Freq: Once | INTRAMUSCULAR | Status: AC
  Administered 2014-01-18: 4 mg via INTRAVENOUS
  Filled 2014-01-18: qty 1

## 2014-01-18 MED ORDER — HYDROCODONE-ACETAMINOPHEN 5-325 MG PO TABS: 1.0000 | ORAL_TABLET | Freq: Four times a day (QID) | ORAL | Status: DC | PRN

## 2014-01-18 MED ORDER — HYDROCODONE-ACETAMINOPHEN 5-325 MG PO TABS
1.0000 | ORAL_TABLET | Freq: Once | ORAL | Status: AC
  Administered 2014-01-18: 1 via ORAL
  Filled 2014-01-18: qty 1

## 2014-01-18 NOTE — ED Notes (Signed)
Vascular phoned regarding the status of patient being transported.  Order being clarified and reordered.
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
## 2014-01-18 NOTE — ED Notes (Signed)
Pt sts had cath with stents placed last Thursday; pt sts having increasing bruising to cath site with pain and some dull CP; pt sts is taking blood thiinners

## 2014-01-18 NOTE — ED Notes (Signed)
[place], [place] at bedside

## 2014-01-18 NOTE — Discharge Instructions (Signed)
Extensive workup here today without signs or evidence of worsening cardiac disease. Take pain medicine as needed for the groin pain. Follow-up with cardiology as directed. Return for any new or worse symptoms.

## 2014-01-18 NOTE — ED Notes (Signed)
Pt given meal and sprite, aware that we are awaiting cardiology consult at this time. When I talked to him about putting him back on the monitor, the patient says he does not want to be put back on the monitor unless he will be staying.

## 2014-01-18 NOTE — Progress Notes (Signed)
Right groin ultrasound completed.  No evidence of pseudoaneurysm.
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
## 2014-01-18 NOTE — ED Provider Notes (Signed)
Results for orders placed during the hospital encounter of 01/18/14  CBC      Result Value Ref Range   WBC 9.3  4.0 - 10.5 K/uL   RBC 4.79  4.22 - 5.81 MIL/uL   Hemoglobin 15.3  13.0 - 17.0 g/dL   HCT 45.0  39.0 - 52.0 %   MCV 93.9  78.0 - 100.0 fL   MCH 31.9  26.0 - 34.0 pg   MCHC 34.0  30.0 - 36.0 g/dL   RDW 13.6  11.5 - 15.5 %   Platelets 732 (*) 150 - 400 K/uL  BASIC METABOLIC PANEL      Result Value Ref Range   Sodium 141  137 - 147 mEq/L   Potassium 4.4  3.7 - 5.3 mEq/L   Chloride 104  96 - 112 mEq/L   CO2 24  19 - 32 mEq/L   Glucose, Bld 94  70 - 99 mg/dL   BUN 7  6 - 23 mg/dL   Creatinine, Ser 0.74  0.50 - 1.35 mg/dL   Calcium 9.0  8.4 - 10.5 mg/dL   GFR calc non Af Amer >90  >90 mL/min   GFR calc Af Amer >90  >90 mL/min   Anion gap 13  5 - 15  PRO B NATRIURETIC PEPTIDE      Result Value Ref Range   Pro B Natriuretic peptide (BNP) 470.6 (*) 0 - 125 pg/mL  TROPONIN I      Result Value Ref Range   Troponin I 0.61 (*) <0.30 ng/mL  PROTIME-INR      Result Value Ref Range   Prothrombin Time 13.5  11.6 - 15.2 seconds   INR 1.02  0.00 - 1.49  CK TOTAL AND CKMB      Result Value Ref Range   Total CK 80  7 - 232 U/L   CK, MB 1.9  0.3 - 4.0 ng/mL   Relative Index RELATIVE INDEX IS INVALID  0.0 - 2.5  TROPONIN I      Result Value Ref Range   Troponin I 0.35 (*) <0.30 ng/mL   As per cardiology's note. If repeat troponin and the CK-MB were not significant patient to be discharged home. Troponin is lower than the one done earlier today as suspected this is just recovering from the previous MI. Patient's groin hematoma following the catheterization was also evaluated and this is at baseline. Patient can be discharged home.  Fredia Sorrow, MD 01/18/14 1752

## 2014-01-19 ENCOUNTER — Telehealth: Payer: Self-pay | Admitting: Cardiovascular Disease

## 2014-01-19 NOTE — Telephone Encounter (Signed)
Closed encounter °

## 2014-01-30 ENCOUNTER — Ambulatory Visit: Payer: Self-pay | Admitting: Physician Assistant

## 2014-02-02 ENCOUNTER — Encounter: Payer: Self-pay | Admitting: Physician Assistant

## 2014-02-02 ENCOUNTER — Ambulatory Visit (INDEPENDENT_AMBULATORY_CARE_PROVIDER_SITE_OTHER): Payer: Medicaid Other | Admitting: Physician Assistant

## 2014-02-02 VITALS — BP 122/90 | HR 71 | Ht 65.0 in | Wt 198.6 lb

## 2014-02-02 DIAGNOSIS — I255 Ischemic cardiomyopathy: Secondary | ICD-10-CM | POA: Diagnosis not present

## 2014-02-02 DIAGNOSIS — I2510 Atherosclerotic heart disease of native coronary artery without angina pectoris: Secondary | ICD-10-CM

## 2014-02-02 DIAGNOSIS — I10 Essential (primary) hypertension: Secondary | ICD-10-CM | POA: Diagnosis not present

## 2014-02-02 DIAGNOSIS — F419 Anxiety disorder, unspecified: Secondary | ICD-10-CM

## 2014-02-02 DIAGNOSIS — F1010 Alcohol abuse, uncomplicated: Secondary | ICD-10-CM | POA: Diagnosis not present

## 2014-02-02 DIAGNOSIS — Z720 Tobacco use: Secondary | ICD-10-CM

## 2014-02-02 DIAGNOSIS — E785 Hyperlipidemia, unspecified: Secondary | ICD-10-CM

## 2014-02-02 DIAGNOSIS — I2583 Coronary atherosclerosis due to lipid rich plaque: Secondary | ICD-10-CM

## 2014-02-02 NOTE — Assessment & Plan Note (Signed)
He is trying to cut back and working toward cessation.

## 2014-02-02 NOTE — Assessment & Plan Note (Signed)
Blood pressure well controlled

## 2014-02-02 NOTE — Assessment & Plan Note (Addendum)
Status post bare-metal stent to the RCA. No further chest pain. He is on aspirin, Plavix, beta blocker, statin and taking all his meds.  He had all the bottles with him in a bag today.

## 2014-02-02 NOTE — Patient Instructions (Signed)
Your physician recommends that you schedule a follow-up appointment in: 3 Months with Dr Gwenlyn Found

## 2014-02-02 NOTE — Assessment & Plan Note (Signed)
Lipid Panel     Component Value Date/Time   CHOL 203* 01/11/2014 0944   TRIG 116 01/11/2014 0944   HDL 49 01/11/2014 0944   CHOLHDL 4.1 01/11/2014 0944   VLDL 23 01/11/2014 0944   LDLCALC 131* 01/11/2014 0944    Continue statin

## 2014-02-02 NOTE — Progress Notes (Signed)
Date:  02/02/2014   ID:  [name], DOB Oct 27, 1965, MRN [id number]  PCP:  No PCP Per Patient  Primary Cardiologist:  Gwenlyn Found  History of Present Illness: Joshua Bird is a 48 y.o. male a 48 y/o M with ongoing tobacco abuse and alcohol abuse, HTN, and reported history of CAD s/p stenting in [place] in 2014 who presented to [facility] with chest pain. He has history of medication noncompliance because of fiscal constraints. He stopped taking his medications approximately 6 months ago. He developed acute onset of substernal chest pain an approximate 7:00 this morning and was brought by EMS. He was noted to have inferior ST segment elevation and code STEMI was called.  The patient was brought emergently to the cath lab for angiography and intervention. He was found to have a thrombotic lesion occluded large dominant RCA which was treated with successful PCI, thrombectomy and bare metal stenting. LVEF 40-45%. Initial troponin negative then 8.09. Other pertinent labs include glucose 184, mildly elevated AST/ALT 77/81, LDL 131 and platelets 686. Patient was advised on multiple occasions regarding the importance of taking his medications. He was also counseled on smoking cessation. He was transferred to telemetry floor on October 23. He was seen by cardiac rehabilitation on October 23. He was unsteady on his feet when getting up but ambulated 450 feet. The patient stated that this was normal for him to do ankles knees and back pain. The patient was seen by Dr. Wynonia Lawman during rounds on October 24 and was feeling better at that time. Dr. Wynonia Lawman noted the patient had very poor insight into his overall medical condition. Sometime after 12 noon on October 24 the patient removed all IVs got dressed and left the hospital [place].  Patient presented today for posthospital evaluation. He reports he's been taking all the medication as directed. He does have the medications and diet  with him. He has decreased his alcohol intake to 2 drinks per day. He is also trying to decrease his tobacco usage. He reports being extremely anxious and says he started crying on his way here. He does live with his mother and he is worried about getting a job.  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  Reports being short of breath but denies nausea, vomiting, fever, chest pain, orthopnea, dizziness, PND, cough, congestion, abdominal pain, hematochezia, melena, lower extremity edema.  He also reports hip and knee pain which has been ongoing for years.   Wt Readings from Last 3 Encounters:  02/02/14 198 lb 9.6 oz (90.084 kg)  01/13/14 194 lb 12.8 oz (88.361 kg)     Past Medical History  Diagnosis Date  . Coronary artery disease     a. s/p stenting in [place] 2014.  Marland Kitchen Myocardial infarction   . Hypertension   . Depression   . Anxiety   . Alcohol abuse   . Noncompliance with medication regimen     Current Outpatient Prescriptions  Medication Sig Dispense Refill  . aspirin 81 MG chewable tablet Chew 1 tablet (81 mg total) by mouth daily.    Marland Kitchen atorvastatin (LIPITOR) 40 MG tablet Take 1 tablet (40 mg total) by mouth daily at 6 PM. 30 tablet 5  . clopidogrel (PLAVIX) 75 MG tablet Take 1 tablet (75 mg total) by mouth daily with breakfast. 30 tablet 5  . folic acid (FOLVITE) 1 MG tablet Take 1 tablet (1 mg total) by  mouth daily. 30 tablet 5  . metoprolol tartrate (LOPRESSOR) 25 MG tablet Take 0.5 tablets (12.5 mg total) by mouth 2 (two) times daily. 30 tablet 5  . Multiple Vitamin (MULTIVITAMIN WITH MINERALS) TABS tablet Take 1 tablet by mouth daily.    . nitroGLYCERIN (NITROSTAT) 0.4 MG SL tablet Place 0.4 mg under the tongue every 5 (five) minutes as needed for chest pain.    . Omega-3 Fatty Acids (FISH OIL) 1000 MG CAPS Take 1,000 mg by mouth daily.    Marland Kitchen thiamine 100 MG tablet Take 1 tablet (100 mg total) by mouth daily. 30 tablet 5   No current  facility-administered medications for this visit.    Allergies:   No Known Allergies  Social History:  The patient  reports that he has been smoking Cigarettes.  He has a 45 pack-year smoking history. He has quit using smokeless tobacco. His smokeless tobacco use included Chew. He reports that he drinks about 58.8 oz of alcohol per week. He reports that he uses illicit drugs (Marijuana).   Family history:   Family History  Problem Relation Age of Onset  . Diabetes Mellitus II Mother     ROS:  Please see the history of present illness.  All other systems reviewed and negative.   PHYSICAL EXAM: VS:  BP 122/90 mmHg  Pulse 71  Ht 5\' 5"  (1.651 m)  Wt 198 lb 9.6 oz (90.084 kg)  BMI 33.05 kg/m2 Well nourished, well developed, in no acute distress HEENT: Pupils are equal round react to light accommodation extraocular movements are intact.  Neck: no JVDNo cervical lymphadenopathy. Cardiac: Regular rate and rhythm without murmurs rubs or gallops. Lungs:  clear to auscultation bilaterally, no wheezing, rhonchi or rales Abd: soft, nontender, positive bowel sounds all quadrants, no hepatosplenomegaly Ext: no lower extremity edema.  2+ radial and dorsalis pedis pulses. Skin: warm and dry Neuro:  Grossly normal  EKG: Normal sinus rhythm and complete right bundle branch block inferior T-wave inversion rate 71 bpm   ASSESSMENT AND PLAN:  Problem List Items Addressed This Visit    Alcohol abuse - Primary   Anxiety   CAD (coronary artery disease)    Status post bare-metal stent to the RCA. No further chest pain. He is on aspirin, Plavix, beta blocker, statin and taking all his meds.  He had all the bottles with him in a bag today.    Relevant Medications      nitroGLYCERIN (NITROSTAT) 0.4 MG SL tablet   Other Relevant Orders      EKG 12-Lead   Cardiomyopathy, ischemic    Patient appears euvolemic.    Relevant Medications      nitroGLYCERIN (NITROSTAT) 0.4 MG SL tablet   HTN  (hypertension)    Blood pressure well-controlled    Relevant Medications      nitroGLYCERIN (NITROSTAT) 0.4 MG SL tablet   Hyperlipidemia    Lipid Panel     Component Value Date/Time   CHOL 203* 01/11/2014 0944   TRIG 116 01/11/2014 0944   HDL 49 01/11/2014 0944   CHOLHDL 4.1 01/11/2014 0944   VLDL 23 01/11/2014 0944   LDLCALC 131* 01/11/2014 0944    Continue statin    Relevant Medications      nitroGLYCERIN (NITROSTAT) 0.4 MG SL tablet   Tobacco abuse    He is trying to cut back and working toward cessation.

## 2014-02-02 NOTE — Assessment & Plan Note (Signed)
Patient appears euvolemic.

## 2014-03-01 ENCOUNTER — Encounter (HOSPITAL_COMMUNITY): Payer: Self-pay | Admitting: Cardiovascular Disease

## 2014-03-20 ENCOUNTER — Encounter (HOSPITAL_COMMUNITY): Payer: Self-pay | Admitting: *Deleted

## 2014-05-04 ENCOUNTER — Ambulatory Visit: Payer: Self-pay | Admitting: Cardiovascular Disease

## 2015-08-09 ENCOUNTER — Inpatient Hospital Stay (HOSPITAL_COMMUNITY)
Admission: RE | Admit: 2015-08-09 | Discharge: 2015-08-12 | DRG: 250 | Disposition: A | Discharge status: Home / Self Care | Payer: Medicaid Other | Source: Ambulatory Visit | Attending: Cardiology | Admitting: Cardiology

## 2015-08-09 ENCOUNTER — Encounter (HOSPITAL_COMMUNITY)
Admission: RE | Disposition: A | Discharge status: Home / Self Care | Payer: Self-pay | Source: Ambulatory Visit | Attending: Cardiology

## 2015-08-09 DIAGNOSIS — I10 Essential (primary) hypertension: Secondary | ICD-10-CM | POA: Diagnosis present

## 2015-08-09 DIAGNOSIS — I213 ST elevation (STEMI) myocardial infarction of unspecified site: Secondary | ICD-10-CM | POA: Diagnosis present

## 2015-08-09 DIAGNOSIS — Z9114 Patient's other noncompliance with medication regimen: Secondary | ICD-10-CM

## 2015-08-09 DIAGNOSIS — E876 Hypokalemia: Secondary | ICD-10-CM | POA: Diagnosis not present

## 2015-08-09 DIAGNOSIS — Y831 Surgical operation with implant of artificial internal device as the cause of abnormal reaction of the patient, or of later complication, without mention of misadventure at the time of the procedure: Secondary | ICD-10-CM | POA: Diagnosis present

## 2015-08-09 DIAGNOSIS — Z7902 Long term (current) use of antithrombotics/antiplatelets: Secondary | ICD-10-CM

## 2015-08-09 DIAGNOSIS — T39016A Underdosing of aspirin, initial encounter: Secondary | ICD-10-CM | POA: Diagnosis present

## 2015-08-09 DIAGNOSIS — I252 Old myocardial infarction: Secondary | ICD-10-CM

## 2015-08-09 DIAGNOSIS — R0989 Other specified symptoms and signs involving the circulatory and respiratory systems: Secondary | ICD-10-CM | POA: Diagnosis present

## 2015-08-09 DIAGNOSIS — G8929 Other chronic pain: Secondary | ICD-10-CM | POA: Diagnosis present

## 2015-08-09 DIAGNOSIS — T82867A Thrombosis of cardiac prosthetic devices, implants and grafts, initial encounter: Principal | ICD-10-CM | POA: Diagnosis present

## 2015-08-09 DIAGNOSIS — Z6831 Body mass index (BMI) 31.0-31.9, adult: Secondary | ICD-10-CM | POA: Diagnosis not present

## 2015-08-09 DIAGNOSIS — I2119 ST elevation (STEMI) myocardial infarction involving other coronary artery of inferior wall: Secondary | ICD-10-CM | POA: Diagnosis present

## 2015-08-09 DIAGNOSIS — Z720 Tobacco use: Secondary | ICD-10-CM | POA: Diagnosis present

## 2015-08-09 DIAGNOSIS — F17210 Nicotine dependence, cigarettes, uncomplicated: Secondary | ICD-10-CM | POA: Diagnosis present

## 2015-08-09 DIAGNOSIS — R0789 Other chest pain: Secondary | ICD-10-CM | POA: Diagnosis present

## 2015-08-09 DIAGNOSIS — T45526A Underdosing of antithrombotic drugs, initial encounter: Secondary | ICD-10-CM | POA: Diagnosis present

## 2015-08-09 DIAGNOSIS — I2111 ST elevation (STEMI) myocardial infarction involving right coronary artery: Secondary | ICD-10-CM | POA: Diagnosis present

## 2015-08-09 DIAGNOSIS — M549 Dorsalgia, unspecified: Secondary | ICD-10-CM | POA: Diagnosis present

## 2015-08-09 DIAGNOSIS — I2510 Atherosclerotic heart disease of native coronary artery without angina pectoris: Secondary | ICD-10-CM | POA: Diagnosis present

## 2015-08-09 DIAGNOSIS — Z91128 Patient's intentional underdosing of medication regimen for other reason: Secondary | ICD-10-CM

## 2015-08-09 DIAGNOSIS — Z9861 Coronary angioplasty status: Secondary | ICD-10-CM

## 2015-08-09 DIAGNOSIS — D7589 Other specified diseases of blood and blood-forming organs: Secondary | ICD-10-CM | POA: Diagnosis present

## 2015-08-09 DIAGNOSIS — Z8659 Personal history of other mental and behavioral disorders: Secondary | ICD-10-CM

## 2015-08-09 DIAGNOSIS — E663 Overweight: Secondary | ICD-10-CM | POA: Diagnosis present

## 2015-08-09 DIAGNOSIS — E785 Hyperlipidemia, unspecified: Secondary | ICD-10-CM | POA: Diagnosis not present

## 2015-08-09 DIAGNOSIS — Z7982 Long term (current) use of aspirin: Secondary | ICD-10-CM

## 2015-08-09 HISTORY — PX: CARDIAC CATHETERIZATION: SHX172

## 2015-08-09 LAB — CBC
HEMATOCRIT: 45.1 % (ref 39.0–52.0)
HEMATOCRIT: 41.6 % (ref 39.0–52.0)
HEMOGLOBIN: 15 g/dL (ref 13.0–17.0)
Hemoglobin: 13.5 g/dL (ref 13.0–17.0)
MCH: 31 pg (ref 26.0–34.0)
MCH: 30.3 pg (ref 26.0–34.0)
MCHC: 33.3 g/dL (ref 30.0–36.0)
MCHC: 32.5 g/dL (ref 30.0–36.0)
MCV: 93.2 fL (ref 78.0–100.0)
MCV: 93.5 fL (ref 78.0–100.0)
Platelets: 994 10*3/uL (ref 150–400)
Platelets: 780 10*3/uL — ABNORMAL HIGH (ref 150–400)
RBC: 4.84 MIL/uL (ref 4.22–5.81)
RBC: 4.45 MIL/uL (ref 4.22–5.81)
RDW: 14.5 % (ref 11.5–15.5)
RDW: 14.4 % (ref 11.5–15.5)
WBC: 11.9 10*3/uL — ABNORMAL HIGH (ref 4.0–10.5)
WBC: 11.4 10*3/uL — ABNORMAL HIGH (ref 4.0–10.5)

## 2015-08-09 LAB — TROPONIN I: Troponin I: 0.24 ng/mL — ABNORMAL HIGH (ref <0.031)

## 2015-08-09 LAB — CREATININE, SERUM
CREATININE: 0.61 mg/dL (ref 0.61–1.24)
GFR calc Af Amer: >60 mL/min (ref >60)

## 2015-08-09 LAB — COMPREHENSIVE METABOLIC PANEL
ALBUMIN: 4.2 g/dL (ref 3.5–5.0)
ALK PHOS: 94 U/L (ref 38–126)
ALT: 46 U/L (ref 17–63)
AST: 36 U/L (ref 15–41)
Anion gap: 13 (ref 5–15)
BILIRUBIN TOTAL: 0.7 mg/dL (ref 0.3–1.2)
BUN: 12 mg/dL (ref 6–20)
CALCIUM: 9 mg/dL (ref 8.9–10.3)
CO2: 22 mmol/L (ref 22–32)
CREATININE: 0.8 mg/dL (ref 0.61–1.24)
Chloride: 103 mmol/L (ref 101–111)
GFR calc Af Amer: >60 mL/min (ref >60)
GLUCOSE: 124 mg/dL — AB (ref 65–99)
Potassium: 3.6 mmol/L (ref 3.5–5.1)
Sodium: 138 mmol/L (ref 135–145)
TOTAL PROTEIN: 7.3 g/dL (ref 6.5–8.1)

## 2015-08-09 LAB — LIPID PANEL
CHOL/HDL RATIO: 3.2 ratio
CHOLESTEROL: 151 mg/dL (ref 0–200)
HDL: 47 mg/dL (ref >40)
LDL Cholesterol: 83 mg/dL (ref 0–99)
Triglycerides: 104 mg/dL (ref <150)
VLDL: 21 mg/dL (ref 0–40)

## 2015-08-09 LAB — CK TOTAL AND CKMB (NOT AT ARMC)
CK, MB: 3.3 ng/mL (ref 0.5–5.0)
Relative Index: 2.8 — ABNORMAL HIGH (ref 0.0–2.5)
Total CK: 119 U/L (ref 49–397)

## 2015-08-09 LAB — APTT: APTT: 34 s (ref 24–37)

## 2015-08-09 LAB — PROTIME-INR
INR: 1.1 (ref 0.00–1.49)
PROTHROMBIN TIME: 14.4 s (ref 11.6–15.2)

## 2015-08-09 LAB — MRSA PCR SCREENING: MRSA by PCR: NEGATIVE

## 2015-08-09 SURGERY — LEFT HEART CATH AND CORONARY ANGIOGRAPHY
Anesthesia: LOCAL

## 2015-08-09 MED ORDER — ADULT MULTIVITAMIN W/MINERALS CH: 1.0000 | ORAL_TABLET | Freq: Every day | ORAL | Status: DC

## 2015-08-09 MED ORDER — HEPARIN SODIUM (PORCINE) 1000 UNIT/ML IJ SOLN
INTRAMUSCULAR | Status: DC | PRN
  Administered 2015-08-09: 8000 [IU] via INTRAVENOUS
  Administered 2015-08-09: 3000 [IU] via INTRAVENOUS

## 2015-08-09 MED ORDER — SODIUM CHLORIDE 0.9 % WEIGHT BASED INFUSION
1.0000 mL/kg/h | INTRAVENOUS | Status: AC
  Administered 2015-08-09: 1 mL/kg/h via INTRAVENOUS

## 2015-08-09 MED ORDER — TICAGRELOR 90 MG PO TABS
90.0000 mg | ORAL_TABLET | Freq: Two times a day (BID) | ORAL | Status: DC
  Filled 2015-08-09: qty 1

## 2015-08-09 MED ORDER — ASPIRIN 81 MG PO CHEW
81.0000 mg | CHEWABLE_TABLET | Freq: Every day | ORAL | Status: DC
  Administered 2015-08-10 – 2015-08-12 (×3): 81 mg via ORAL
  Filled 2015-08-09 (×3): qty 1

## 2015-08-09 MED ORDER — LIDOCAINE HCL (PF) 1 % IJ SOLN
INTRAMUSCULAR | Status: DC | PRN
  Administered 2015-08-09: 2 mL via INTRADERMAL

## 2015-08-09 MED ORDER — SODIUM CHLORIDE 0.9 % IV SOLN: 250.0000 mL | INTRAVENOUS | Status: DC | PRN

## 2015-08-09 MED ORDER — SODIUM CHLORIDE 0.9% FLUSH
3.0000 mL | Freq: Two times a day (BID) | INTRAVENOUS | Status: DC
  Administered 2015-08-10 – 2015-08-12 (×5): 3 mL via INTRAVENOUS

## 2015-08-09 MED ORDER — FENTANYL CITRATE (PF) 100 MCG/2ML IJ SOLN
INTRAMUSCULAR | Status: DC | PRN
  Administered 2015-08-09: 50 ug via INTRAVENOUS

## 2015-08-09 MED ORDER — TIROFIBAN (AGGRASTAT) BOLUS VIA INFUSION
INTRAVENOUS | Status: DC | PRN
  Administered 2015-08-09: 2250 ug via INTRAVENOUS

## 2015-08-09 MED ORDER — HEPARIN (PORCINE) IN NACL 2-0.9 UNIT/ML-% IJ SOLN
INTRAMUSCULAR | Status: DC | PRN
  Administered 2015-08-09: 1500 mL

## 2015-08-09 MED ORDER — FOLIC ACID 1 MG PO TABS: 1.0000 mg | ORAL_TABLET | Freq: Every day | ORAL | Status: DC

## 2015-08-09 MED ORDER — GABAPENTIN 300 MG PO CAPS
300.0000 mg | ORAL_CAPSULE | Freq: Three times a day (TID) | ORAL | Status: DC
  Administered 2015-08-10 – 2015-08-12 (×8): 300 mg via ORAL
  Filled 2015-08-09 (×7): qty 1

## 2015-08-09 MED ORDER — TIROFIBAN HCL IN NACL 5-0.9 MG/100ML-% IV SOLN
INTRAVENOUS | Status: DC | PRN
  Administered 2015-08-09: 0.15 ug/kg/min via INTRAVENOUS

## 2015-08-09 MED ORDER — OXYCODONE-ACETAMINOPHEN 5-325 MG PO TABS
1.0000 | ORAL_TABLET | ORAL | Status: DC | PRN
  Administered 2015-08-09 – 2015-08-12 (×10): 2 via ORAL
  Filled 2015-08-09 (×10): qty 2

## 2015-08-09 MED ORDER — SODIUM CHLORIDE 0.9% FLUSH: 3.0000 mL | INTRAVENOUS | Status: DC | PRN

## 2015-08-09 MED ORDER — PNEUMOCOCCAL VAC POLYVALENT 25 MCG/0.5ML IJ INJ
0.5000 mL | INJECTION | INTRAMUSCULAR | Status: DC
  Filled 2015-08-09: qty 0.5

## 2015-08-09 MED ORDER — HEPARIN SODIUM (PORCINE) 5000 UNIT/ML IJ SOLN
5000.0000 [IU] | Freq: Three times a day (TID) | INTRAMUSCULAR | Status: DC
  Administered 2015-08-10 – 2015-08-12 (×6): 5000 [IU] via SUBCUTANEOUS
  Filled 2015-08-09 (×6): qty 1

## 2015-08-09 MED ORDER — NITROGLYCERIN IN D5W 200-5 MCG/ML-% IV SOLN
0.0000 ug/min | INTRAVENOUS | Status: DC
  Administered 2015-08-09: 5 ug/min via INTRAVENOUS
  Filled 2015-08-09: qty 250

## 2015-08-09 MED ORDER — OMEGA-3-ACID ETHYL ESTERS 1 G PO CAPS
1.0000 g | ORAL_CAPSULE | Freq: Every day | ORAL | Status: DC
  Administered 2015-08-10 – 2015-08-12 (×3): 1 g via ORAL
  Filled 2015-08-09 (×3): qty 1

## 2015-08-09 MED ORDER — MORPHINE SULFATE (PF) 2 MG/ML IV SOLN
2.0000 mg | INTRAVENOUS | Status: DC | PRN
  Administered 2015-08-11: 2 mg via INTRAVENOUS
  Filled 2015-08-09: qty 1

## 2015-08-09 MED ORDER — HEPARIN (PORCINE) IN NACL 2-0.9 UNIT/ML-% IJ SOLN
INTRAMUSCULAR | Status: DC | PRN
  Administered 2015-08-09: 500 mL

## 2015-08-09 MED ORDER — ONDANSETRON HCL 4 MG/2ML IJ SOLN: 4.0000 mg | Freq: Four times a day (QID) | INTRAMUSCULAR | Status: DC | PRN

## 2015-08-09 MED ORDER — IOPAMIDOL (ISOVUE-370) INJECTION 76%
INTRAVENOUS | Status: DC | PRN
  Administered 2015-08-09: 170 mL via INTRA_ARTERIAL

## 2015-08-09 MED ORDER — VITAMIN B-1 100 MG PO TABS: 100.0000 mg | ORAL_TABLET | Freq: Every day | ORAL | Status: DC

## 2015-08-09 MED ORDER — ATORVASTATIN CALCIUM 40 MG PO TABS
40.0000 mg | ORAL_TABLET | Freq: Every day | ORAL | Status: DC
  Administered 2015-08-10 – 2015-08-11 (×2): 40 mg via ORAL
  Filled 2015-08-09 (×2): qty 1

## 2015-08-09 MED ORDER — TICAGRELOR 90 MG PO TABS
90.0000 mg | ORAL_TABLET | Freq: Two times a day (BID) | ORAL | Status: DC
  Administered 2015-08-10 – 2015-08-12 (×5): 90 mg via ORAL
  Filled 2015-08-09 (×5): qty 1

## 2015-08-09 MED ORDER — CARVEDILOL 12.5 MG PO TABS
12.5000 mg | ORAL_TABLET | Freq: Two times a day (BID) | ORAL | Status: DC
  Administered 2015-08-10 – 2015-08-11 (×2): 12.5 mg via ORAL
  Filled 2015-08-09 (×2): qty 1

## 2015-08-09 MED ORDER — HEPARIN (PORCINE) IN NACL 2-0.9 UNIT/ML-% IJ SOLN
INTRAMUSCULAR | Status: DC | PRN
  Administered 2015-08-09: 20:00:00

## 2015-08-09 MED ORDER — PANTOPRAZOLE SODIUM 40 MG PO TBEC
40.0000 mg | DELAYED_RELEASE_TABLET | Freq: Every day | ORAL | Status: DC
  Administered 2015-08-09 – 2015-08-12 (×4): 40 mg via ORAL
  Filled 2015-08-09 (×4): qty 1

## 2015-08-09 MED ORDER — NITROGLYCERIN 0.4 MG SL SUBL
0.4000 mg | SUBLINGUAL_TABLET | SUBLINGUAL | Status: DC | PRN
  Filled 2015-08-09: qty 1

## 2015-08-09 MED ORDER — NITROGLYCERIN 1 MG/10 ML FOR IR/CATH LAB
INTRA_ARTERIAL | Status: DC | PRN
  Administered 2015-08-09: 200 ug via INTRACORONARY

## 2015-08-09 MED ORDER — TICAGRELOR 90 MG PO TABS
ORAL_TABLET | ORAL | Status: DC | PRN
  Administered 2015-08-09: 180 mg via ORAL

## 2015-08-09 MED ORDER — LISINOPRIL 5 MG PO TABS
5.0000 mg | ORAL_TABLET | Freq: Every day | ORAL | Status: DC
  Administered 2015-08-10 – 2015-08-12 (×2): 5 mg via ORAL
  Filled 2015-08-09 (×3): qty 1

## 2015-08-09 MED ORDER — TIROFIBAN HCL IN NACL 5-0.9 MG/100ML-% IV SOLN
0.1500 ug/kg/min | INTRAVENOUS | Status: AC
  Administered 2015-08-09 – 2015-08-10 (×4): 0.15 ug/kg/min via INTRAVENOUS
  Filled 2015-08-09 (×3): qty 100

## 2015-08-09 MED ORDER — ACETAMINOPHEN 325 MG PO TABS: 650.0000 mg | ORAL_TABLET | ORAL | Status: DC | PRN

## 2015-08-09 SURGICAL SUPPLY — 22 items
BALLN EUPHORA RX 2.25X12 (BALLOONS) ×2
BALLN EUPHORA RX 3.0X15 (BALLOONS) ×2
BALLN ~~LOC~~ EUPHORA RX 4.5X20 (BALLOONS) ×2
BALLOON EUPHORA RX 2.25X12 (BALLOONS) ×1 IMPLANT
BALLOON EUPHORA RX 3.0X15 (BALLOONS) ×1 IMPLANT
BALLOON ~~LOC~~ EUPHORA RX 4.5X20 (BALLOONS) ×1 IMPLANT
CATH INFINITI 5 FR JL3.5 (CATHETERS) ×2 IMPLANT
CATH INFINITI 5FR ANG PIGTAIL (CATHETERS) ×2 IMPLANT
CATH VISTA GUIDE 6FR AL1 (CATHETERS) ×2 IMPLANT
CATH VISTA GUIDE 6FR JR4 (CATHETERS) ×2 IMPLANT
DEVICE RAD COMP TR BAND LRG (VASCULAR PRODUCTS) ×2 IMPLANT
ELECT DEFIB PAD ADLT CADENCE (PAD) ×2 IMPLANT
GLIDESHEATH SLEND SS 6F .021 (SHEATH) ×2 IMPLANT
GUIDE CATH RUNWAY 6FR AL 1 (CATHETERS) ×2 IMPLANT
KIT ENCORE 26 ADVANTAGE (KITS) ×2 IMPLANT
KIT HEART LEFT (KITS) ×2 IMPLANT
PACK CARDIAC CATHETERIZATION (CUSTOM PROCEDURE TRAY) ×2 IMPLANT
SYR MEDRAD MARK V 150ML (SYRINGE) ×2 IMPLANT
TRANSDUCER W/STOPCOCK (MISCELLANEOUS) ×2 IMPLANT
TUBING CIL FLEX 10 FLL-RA (TUBING) ×2 IMPLANT
WIRE ASAHI PROWATER 180CM (WIRE) ×4 IMPLANT
WIRE SAFE-T 1.5MM-J .035X260CM (WIRE) ×2 IMPLANT

## 2015-08-09 NOTE — H&P (Addendum)
HPI: Patient is a 50 yo M with h/o EtOH abuse, HTN, HLD, and CAD s/p inferior STEMI and multiple previous interventions to his RCA (initial MI in 2014 at [facility], recurrent STEMI in 10/15 in setting of noncompliance, and last intervention Xience DES to RCA in 04/24/14 in setting of late stent thrombosis secondary to noncompliance) who presented to [facility] as an inferior STEMI.    Patient stopped his aspirin and Brilinta on Tuesday (4 days ago) in anticipation of a spinal injection to help with his chronic back pain.  Patient reports that he had sudden onset chest pressure and shortness of breath earlier this evening.  He got up to walk to the fridge to get a beer when he developed these symptoms and knew he was having another heart attack.  He called EMS and EKG revealed inferior ST elevations.  He remained hemodynamically stable.    He went to the cath lab emergently and angiogram (via right radial artery) revealed 100%  thrombotic occlusion of the mid- RCA.  Thrombectomy and POBA from proximal to distal RCA were performed and he was started on tirofiban gtt and reloaded on ticagrelor.  After his intervention, he continues to have chest pain, although it is less severe than presentation.  He drinks 6-8 beers a night.  He denies history of withdrawal.  He smokes 1 ppd, but states he is ready to quit now.    Review of Systems:     Cardiac Review of Systems: {Y] = yes '[ ]'$  = no  Chest Pain [  y  ]  Resting SOB [ y  ] Exertional SOB  [ y ]  Vertell Limber Florencio.Farrier  ]   Pedal Edema Blue.Reese   ]    Palpitations [  ] Syncope  [  ]   Presyncope [   ]  General Review of Systems: [Y] = yes [  ]=no Constitional: recent weight change [  ]; anorexia [  ]; fatigue [  ]; nausea [  ]; night sweats [  ]; fever [  ]; or chills [  ];                                                                     Dental: poor dentition[ y ];   Eye : blurred vision [  ]; diplopia [   ]; vision changes [  ];  Amaurosis fugax[   ]; Resp: cough [  ];  wheezing[  ];  hemoptysis[  ]; shortness of breath[  ]; paroxysmal nocturnal dyspnea[  ]; dyspnea on exertion[  ]; or orthopnea[  ];  GI:  gallstones[  ], vomiting[  ];  dysphagia[  ]; melena[  ];  hematochezia [  ]; heartburn[  ];   GU: kidney stones [  ]; hematuria[  ];   dysuria [  ];  nocturia[  ];               Skin: rash [  ], swelling[  ];, hair loss[  ];  peripheral edema[ y ];  or itching[  ]; Musculosketetal: myalgias[y  ];  joint swelling[ y ];  joint erythema[  ];  joint pain[  ];  back pain[  ];  Heme/Lymph: bruising[  ];  bleeding[  ];  anemia[  ];  Neuro: TIA[  ];  headaches[  ];  stroke[  ];  vertigo[  ];  seizures[  ];   paresthesias[  ];  difficulty walking[  ];  Psych:depression[  ]; anxiety[  ];  Endocrine: diabetes[  ];  thyroid dysfunction[  ];  Other:  Past Medical History  Diagnosis Date  . Coronary artery disease     s/p stenting in [place] 2014.  Marland Kitchen Myocardial infarction ([place]) 2014, 01/11/14  . Hypertension   . Depression   . Anxiety   . Alcohol abuse   . Noncompliance with medication regimen    Home Meds: aspirin (ECOTRIN) 81 MG tablet, Take 81 mg by mouth daily atorvastatin (LIPITOR) 80 MG tablet, TAKE 1 TABLET AT BEDTIME.,  carvedilol (COREG) 12.5 mg BID gabapentin (NEURONTIN) 300 MG capsule, Take 1 capsule (300 mg total) by mouth Three (3) times a day lisinopril (PRINIVIL,ZESTRIL) 5 MG tablet, TAKE 1 TABLET DAILY ticagrelor (BRILINTA) 90 mg Tab, Take 1 tablet (90 mg total) by mouth Two (2) times a day.   No Known Allergies  Social History   Social History  . Marital Status: Divorced    Spouse Name: N/A  . Number of Children: N/A  . Years of Education: N/A   Occupational History  . Not on file.   Social History Main Topics  . Smoking status: Current Every Day Smoker -- 1.50 packs/day for 30 years    Types: Cigarettes  . Smokeless tobacco: Former Systems developer    Types: Chew  . Alcohol Use: 58.8 oz/week    98 Cans of beer per week      Comment: 12-14 beers per day  . Drug Use: Yes    Special: Marijuana  . Sexual Activity: Not Currently   Other Topics Concern  . Not on file   Social History Narrative    Family History  Problem Relation Age of Onset  . Diabetes Mellitus II Mother     PHYSICAL EXAM: Filed Vitals:   08/09/15 1958 08/09/15 2003  BP: 127/95 146/104  Pulse: 80 82  Resp: 14 10   General:  Overweight CA M, uncomfortable, but in no acute distress HEENT: normal Neck: supple. JVP to ~ 10 cm. Carotids 2+ bilat; possible right carotid bruit Cor: PMI nondisplaced. Regular rate & rhythm. No rubs, gallops or murmurs. Lungs: clear Abdomen: soft, nontender, nondistended. No hepatosplenomegaly. Good bowel sounds. Extremities: no cyanosis, clubbing, rash, edema. TR band in place on right wrist without hematoma, clean and dry Neuro: alert & oriented x 3, cranial nerves grossly intact. moves all 4 extremities w/o difficulty. Affect pleasant.  ECG:  initial ECG: STE in II, III, and aVF with reciprocal STDs in V1-V2, inferior Q waves Post-PCI ECG: STEs persist, but improved  Results for orders placed or performed during the hospital encounter of 08/09/15 (from the past 24 hour(s))  CBC     Status: Abnormal   Collection Time: 08/09/15  7:05 PM  Result Value Ref Range   WBC 11.9 (H) 4.0 - 10.5 K/uL   RBC 4.84 4.22 - 5.81 MIL/uL   Hemoglobin 15.0 13.0 - 17.0 g/dL   HCT 45.1 39.0 - 52.0 %   MCV 93.2 78.0 - 100.0 fL   MCH 31.0 26.0 - 34.0 pg   MCHC 33.3 30.0 - 36.0 g/dL   RDW 14.5 11.5 - 15.5 %   Platelets 994 (HH) 150 - 400 K/uL  Protime-INR     Status: None  Collection Time: 08/09/15  7:05 PM  Result Value Ref Range   Prothrombin Time 14.4 11.6 - 15.2 seconds   INR 1.10 0.00 - 1.49  APTT     Status: None   Collection Time: 08/09/15  7:05 PM  Result Value Ref Range   aPTT 34 24 - 37 seconds   No results found.   ASSESSMENT/PLAN  1. Inferior STEMI 2/2 late stent thrombosis in the setting of  discontinuing DAPT. S/p thrombectomy and POBA to RCA with residual thrombosis in PDA. EKG with residual STEs, but improved from arrival EKG -- tirofiban gtt x 18 hrs -- reload Brilinta 180 mg now, then 90 mg BID -- nitroglycerin for continued chest pain with morphine PRN -- continue aspirin 81 mg qday, coreg 12.5 mg BID, and atorvastatin 40 mg qday -- trend troponins -- check lipid panel, TSH, and A1c -- perform post-PCI EKG -- perform TTE -- education on tobacco cessation -- admit to CICU   2. HTN. Currently hypertensive on admission, likely due to pain. -- continue lisinopril and coreg  3.  EtOH abuse -- monitor for signs of withdrawal -- folate, thiamine, and MVI  4. Thrombocytosis, plts >990K on admission, likely an acute phase reactant in setting of acute thrombosis -- monitor CBC  5. Chronic back pain.  -- Continue home gabapentin -- morphine PRN, as above  6. Carotid bruit -- carotid Dopplers ordered  FEN: heart healthy diet   Emergency contact: sister, Randell Patient: [phone]

## 2015-08-10 ENCOUNTER — Inpatient Hospital Stay (HOSPITAL_COMMUNITY): Payer: Medicaid Other

## 2015-08-10 DIAGNOSIS — Z720 Tobacco use: Secondary | ICD-10-CM

## 2015-08-10 DIAGNOSIS — E785 Hyperlipidemia, unspecified: Secondary | ICD-10-CM

## 2015-08-10 DIAGNOSIS — I10 Essential (primary) hypertension: Secondary | ICD-10-CM

## 2015-08-10 LAB — CBC
HCT: 43.5 % (ref 39.0–52.0)
Hemoglobin: 14.4 g/dL (ref 13.0–17.0)
MCH: 30.3 pg (ref 26.0–34.0)
MCHC: 33.1 g/dL (ref 30.0–36.0)
MCV: 91.4 fL (ref 78.0–100.0)
PLATELETS: 874 10*3/uL — AB (ref 150–400)
RBC: 4.76 MIL/uL (ref 4.22–5.81)
RDW: 14.5 % (ref 11.5–15.5)
WBC: 12.4 10*3/uL — ABNORMAL HIGH (ref 4.0–10.5)

## 2015-08-10 LAB — BASIC METABOLIC PANEL
Anion gap: 11 (ref 5–15)
BUN: 11 mg/dL (ref 6–20)
CALCIUM: 9 mg/dL (ref 8.9–10.3)
CO2: 23 mmol/L (ref 22–32)
CREATININE: 0.72 mg/dL (ref 0.61–1.24)
Chloride: 101 mmol/L (ref 101–111)
GFR calc Af Amer: >60 mL/min (ref >60)
GLUCOSE: 143 mg/dL — AB (ref 65–99)
Potassium: 3.6 mmol/L (ref 3.5–5.1)
Sodium: 135 mmol/L (ref 135–145)

## 2015-08-10 LAB — HEMOGLOBIN A1C
Hgb A1c MFr Bld: 5.6 % (ref 4.8–5.6)
MEAN PLASMA GLUCOSE: 114 mg/dL

## 2015-08-10 LAB — TROPONIN I
TROPONIN I: 2.68 ng/mL — AB (ref <0.031)
TROPONIN I: 9.71 ng/mL — AB (ref <0.031)
TROPONIN I: 18.52 ng/mL — AB (ref <0.031)

## 2015-08-10 LAB — ECHOCARDIOGRAM COMPLETE
Height: 65 in
Weight: 3068.8 oz

## 2015-08-10 LAB — TSH: TSH: 0.518 u[IU]/mL (ref 0.350–4.500)

## 2015-08-10 MED ORDER — POTASSIUM CHLORIDE CRYS ER 20 MEQ PO TBCR
40.0000 meq | EXTENDED_RELEASE_TABLET | Freq: Once | ORAL | Status: AC
  Administered 2015-08-10: 40 meq via ORAL
  Filled 2015-08-10: qty 2

## 2015-08-10 MED ORDER — FOLIC ACID 1 MG PO TABS
1.0000 mg | ORAL_TABLET | Freq: Every day | ORAL | Status: DC
  Administered 2015-08-10 – 2015-08-12 (×3): 1 mg via ORAL
  Filled 2015-08-10 (×3): qty 1

## 2015-08-10 MED ORDER — ADULT MULTIVITAMIN W/MINERALS CH
1.0000 | ORAL_TABLET | Freq: Every day | ORAL | Status: DC
  Administered 2015-08-10 – 2015-08-12 (×3): 1 via ORAL
  Filled 2015-08-10 (×3): qty 1

## 2015-08-10 MED ORDER — BOOST / RESOURCE BREEZE PO LIQD
1.0000 | Freq: Three times a day (TID) | ORAL | Status: DC
  Administered 2015-08-10 – 2015-08-12 (×6): 1 via ORAL

## 2015-08-10 MED ORDER — LORAZEPAM 1 MG PO TABS
0.0000 mg | ORAL_TABLET | Freq: Four times a day (QID) | ORAL | Status: AC
Start: 2015-08-10 — End: 2015-08-12
  Administered 2015-08-10: 2 mg via ORAL
  Administered 2015-08-10 – 2015-08-11 (×4): 1 mg via ORAL
  Administered 2015-08-11: 2 mg via ORAL
  Administered 2015-08-11: 1 mg via ORAL
  Filled 2015-08-10 (×3): qty 1
  Filled 2015-08-10: qty 2
  Filled 2015-08-10: qty 1
  Filled 2015-08-10: qty 2
  Filled 2015-08-10 (×2): qty 1

## 2015-08-10 MED ORDER — THIAMINE HCL 100 MG/ML IJ SOLN
100.0000 mg | Freq: Every day | INTRAMUSCULAR | Status: DC
  Filled 2015-08-10: qty 2

## 2015-08-10 MED ORDER — VITAMIN B-1 100 MG PO TABS
100.0000 mg | ORAL_TABLET | Freq: Every day | ORAL | Status: DC
Start: 2015-08-10 — End: 2015-08-12
  Administered 2015-08-10 – 2015-08-12 (×3): 100 mg via ORAL
  Filled 2015-08-10 (×3): qty 1

## 2015-08-10 MED ORDER — LORAZEPAM 1 MG PO TABS: 0.0000 mg | ORAL_TABLET | Freq: Two times a day (BID) | ORAL | Status: DC

## 2015-08-10 MED ORDER — LORAZEPAM 2 MG/ML IJ SOLN: 1.0000 mg | Freq: Four times a day (QID) | INTRAMUSCULAR | Status: DC | PRN

## 2015-08-10 MED ORDER — LORAZEPAM 1 MG PO TABS
1.0000 mg | ORAL_TABLET | Freq: Four times a day (QID) | ORAL | Status: DC | PRN
  Administered 2015-08-10 – 2015-08-12 (×3): 1 mg via ORAL
  Filled 2015-08-10 (×2): qty 1

## 2015-08-10 NOTE — Progress Notes (Signed)
  Echocardiogram 2D Echocardiogram has been performed.  Johny Chess 08/10/2015, 4:39 PM

## 2015-08-10 NOTE — Progress Notes (Signed)
Pt walking about the room despite explaining multiple times that he is connected to multiple wires and machines. States he will "take off wires" to do what he needs. Educated on need to continually monitor patient.

## 2015-08-10 NOTE — Progress Notes (Signed)
Cardiac Rehab Phase I Note: Patient still complaining of chest tightness. MD at bedside. TR band remains in place. Nitro gtt continues. Not appropriate to ambulate at this time. MI booklet, smoking cessation, and diet information given but not reviewed. "Fake cigarette" smoking cessation tool given and use reviewed with patient. Primary RN updated. Will follow up on Monday and educate prior to discharge.

## 2015-08-10 NOTE — Progress Notes (Signed)
Pt continues to be noncompliant with wrist restrictions despite multiple attempts at re-education.

## 2015-08-10 NOTE — Progress Notes (Addendum)
Cardiologist:  ([facility] previously) Subjective:  Minimal chest discomfort post PCI of RCA, no shortness of breath, no fevers no bleeding Wants to know if he can have something to help his breathing.    Objective:  Vital Signs in the last 24 hours: Temp:  [97.9 F (36.6 C)-98.2 F (36.8 C)] 97.9 F (36.6 C) (05/20 0400) Pulse Rate:  [0-94] 69 (05/20 0600) Resp:  [0-31] 17 (05/20 0600) BP: (91-146)/(66-104) 113/90 mmHg (05/20 0600) SpO2:  [0 %-98 %] 96 % (05/20 0600) Weight:  [191 lb 12.8 oz (87 kg)] 191 lb 12.8 oz (87 kg) (05/19 2030)  Intake/Output from previous day: 05/19 0701 - 05/20 0700 In: 939.5 [P.O.:712; I.V.:227.5] Out: 350 [Urine:350]   Physical Exam: General: Well developed, well nourished, in no acute distress. Head:  Normocephalic and atraumatic. Lungs: Clear to auscultation and percussion. Heart: Normal S1 and S2.  No murmur, rubs or gallops.  Abdomen: soft, non-tender, positive bowel sounds. Extremities: No clubbing or cyanosis. No edema. Cath site radial, minor hematoma (sore), good distal perfusion Neurologic: Alert and oriented x 3.    Lab Results:  Recent Labs  08/09/15 2255 08/10/15 0429  WBC 11.4* 12.4*  HGB 13.5 14.4  PLT 780* 874*    Recent Labs  08/09/15 1905 08/09/15 2255 08/10/15 0429  NA 138  --  135  K 3.6  --  3.6  CL 103  --  101  CO2 22  --  23  GLUCOSE 124*  --  143*  BUN 12  --  11  CREATININE 0.80 0.61 0.72    Recent Labs  08/09/15 2255 08/10/15 0429  TROPONINI 0.24* 2.68*   Hepatic Function Panel  Recent Labs  08/09/15 1905  PROT 7.3  ALBUMIN 4.2  AST 36  ALT 46  ALKPHOS 94  BILITOT 0.7    Recent Labs  08/09/15 1905  CHOL 151   No results for input(s): PROTIME in the last 72 hours.  Imaging: No results found. Personally viewed.   Telemetry: No adverse arrhythmias Personally viewed.   EKG:  Sinus rhythm, ST elevation inferior Personally viewed.  Cardiac Studies:  Cardiac catheterization  08/09/15-Dr. Martinique:   100% thrombotic occlusion mid RCA status post thrombectomy, balloon angioplasty proximal to distal RCA. Thrombotic occlusion of PDA.  Ost RPDA to RPDA lesion, 100% stenosed. The lesion was previously treated with a stent (unknown type) at an unknown time in past.  There is mild to moderate left ventricular systolic dysfunction.  Mid RCA lesion, 100% stenosed. Post intervention, there is a 0% residual stenosis. The lesion was previously treated with a stent (unknown type) one to two years ago. 1. 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 Moderate LV dysfunction  3. Successful POBA of the proximal to distal RCA.  4. Persistent occlusion of the PDA with thrombus.  Plan: DAPT indefinitely. He should never stop both ASA and P2Y12 together. Will continue Aggrastat for 18 hours. Cycle enzymes. Check Echo tomorrow. Risk factor modification.   Meds: Scheduled Meds: . aspirin  81 mg Oral Daily  . atorvastatin  40 mg Oral q1800  . carvedilol  12.5 mg Oral BID WC  . feeding supplement  1 Container Oral TID BM  . folic acid  1 mg Oral Daily  . gabapentin  300 mg Oral TID  . heparin  5,000 Units Subcutaneous Q8H  . lisinopril  5 mg Oral Daily  . multivitamin with minerals  1 tablet Oral Daily  . omega-3  acid ethyl esters  1 g Oral Daily  . pantoprazole  40 mg Oral Daily  . pneumococcal 23 valent vaccine  0.5 mL Intramuscular Tomorrow-1000  . sodium chloride flush  3 mL Intravenous Q12H  . thiamine  100 mg Oral Daily  . ticagrelor  90 mg Oral BID   Continuous Infusions: . nitroGLYCERIN 7.5 mcg/min (08/10/15 0757)  . tirofiban 0.15 mcg/kg/min (08/10/15 0313)   PRN Meds:.sodium chloride, acetaminophen, morphine injection, nitroGLYCERIN, ondansetron (ZOFRAN) IV, oxyCODONE-acetaminophen, sodium chloride flush  Assessment/Plan:  Principal Problem:   STEMI (ST elevation myocardial infarction) ([place]) Active Problems:   CAD (coronary artery  disease)   HTN (hypertension)   Hyperlipidemia   Tobacco abuse   ST elevation (STEMI) myocardial infarction involving right coronary artery ([place])  50 year old male with previous RCA STEMI, stent thrombosis in the past, here with thrombotic occlusion of RCA status post plain old balloon angioplasty/thrombectomy with tirofiban.  STEMI, inferior/RCA  - Balloon angioplasty and thrombectomy only of RCA by Dr. Martinique with residual thrombus burden in the PDA  - Reloaded Brilinta, continue aspirin (both were on hold recently for an upcoming spinal injection)  - Checking echocardiogram  - Cardiac rehabilitation  - Watch for any signs of mechanical failure.  - Current troponin peak 2.7.  - Continuing tirofiban until completion given thrombus burden  Hypokalemia  - Mild, 3.6, replete  Essential hypertension  - Well controlled  Alcohol use  - Watch for any signs of withdrawal  - CIWA protocol order  Hyperlipidemia  - Continue with statin  Known CAD  - Prior RCA intervention with complication of stent thrombosis in the past. Lifelong DAPT.  Tobacco use  - cessation  - likely contributing to issues breathing as well.   Thrombocytosis  - likely acute phase reactant  Candee Furbish 08/10/2015, 7:58 AM

## 2015-08-11 ENCOUNTER — Encounter (HOSPITAL_COMMUNITY): Payer: Self-pay

## 2015-08-11 ENCOUNTER — Inpatient Hospital Stay (HOSPITAL_COMMUNITY): Payer: Medicaid Other

## 2015-08-11 DIAGNOSIS — R0989 Other specified symptoms and signs involving the circulatory and respiratory systems: Secondary | ICD-10-CM

## 2015-08-11 LAB — TROPONIN I
TROPONIN I: 10.93 ng/mL — AB (ref <0.031)
TROPONIN I: 15.71 ng/mL — AB (ref <0.031)

## 2015-08-11 MED ORDER — CARVEDILOL 6.25 MG PO TABS
6.2500 mg | ORAL_TABLET | Freq: Two times a day (BID) | ORAL | Status: DC
  Administered 2015-08-11 – 2015-08-12 (×2): 6.25 mg via ORAL
  Filled 2015-08-11 (×2): qty 1

## 2015-08-11 NOTE — Progress Notes (Signed)
Cardiologist:  ([facility] previously) Subjective:  Minimal chest discomfort post PCI of RCA,  no fevers no bleeding Wants to know if he can have something to help his breathing. Feels some shortness of breath with activity, such as bending over to tie his shoes but he thinks is from his back pain also. He states overall that he is feeling better. Less chest discomfort this morning.   Objective:  Vital Signs in the last 24 hours: Temp:  [97.3 F (36.3 C)-98.1 F (36.7 C)] 97.8 F (36.6 C) (05/21 0600) Pulse Rate:  [59-86] 82 (05/20 2100) Resp:  [14-27] 19 (05/21 0900) BP: (90-107)/(47-81) 107/81 mmHg (05/21 0900) SpO2:  [96 %-100 %] 98 % (05/21 0600)  Intake/Output from previous day: 05/20 0701 - 05/21 0700 In: 2266.8 [P.O.:2030; I.V.:236.8] Out: 2700 [Urine:2700]   Physical Exam: General: Well developed, well nourished, in no acute distress. Head:  Normocephalic and atraumatic. Lungs: Clear to auscultation and percussion. Heart: Normal S1 and S2.  No murmur, rubs or gallops.  Abdomen: soft, non-tender, positive bowel sounds. Extremities: No clubbing or cyanosis. No edema. Cath site radial, minor hematoma (sore but improving), good distal perfusion Neurologic: Alert and oriented x 3.    Lab Results:  Recent Labs  08/09/15 2255 08/10/15 0429  WBC 11.4* 12.4*  HGB 13.5 14.4  PLT 780* 874*    Recent Labs  08/09/15 1905 08/09/15 2255 08/10/15 0429  NA 138  --  135  K 3.6  --  3.6  CL 103  --  101  CO2 22  --  23  GLUCOSE 124*  --  143*  BUN 12  --  11  CREATININE 0.80 0.61 0.72    Recent Labs  08/11/15 0040 08/11/15 0656  TROPONINI 15.71* 10.93*   Hepatic Function Panel  Recent Labs  08/09/15 1905  PROT 7.3  ALBUMIN 4.2  AST 36  ALT 46  ALKPHOS 94  BILITOT 0.7    Recent Labs  08/09/15 1905  CHOL 151   No results for input(s): PROTIME in the last 72 hours.  Imaging: No results found. Personally viewed.   Telemetry: No adverse  arrhythmias Personally viewed.   EKG:  Sinus rhythm, ST elevation inferior Personally viewed.  Cardiac Studies:  Cardiac catheterization 08/09/15-Dr. Martinique:   100% thrombotic occlusion mid RCA status post thrombectomy, balloon angioplasty proximal to distal RCA. Thrombotic occlusion of PDA.  Ost RPDA to RPDA lesion, 100% stenosed. The lesion was previously treated with a stent (unknown type) at an unknown time in past.  There is mild to moderate left ventricular systolic dysfunction.  Mid RCA lesion, 100% stenosed. Post intervention, there is a 0% residual stenosis. The lesion was previously treated with a stent (unknown type) one to two years ago. 1. 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 Moderate LV dysfunction  3. Successful POBA of the proximal to distal RCA.  4. Persistent occlusion of the PDA with thrombus.  Plan: DAPT indefinitely. He should never stop both ASA and P2Y12 together. Will continue Aggrastat for 18 hours. Cycle enzymes. Check Echo tomorrow. Risk factor modification.  ECHO 08/10/15 - Left ventricle: The cavity size was normal. Wall thickness was  increased increased in a pattern of mild to moderate LVH.  Systolic function was mildly reduced. The estimated ejection  fraction was in the range of 45% to 50%. Moderate hypokinesis of  the anteroseptal and anterior myocardium. - Left atrium: The atrium was mildly dilated.  Meds: Scheduled Meds: .  aspirin  81 mg Oral Daily  . atorvastatin  40 mg Oral q1800  . carvedilol  6.25 mg Oral BID WC  . feeding supplement  1 Container Oral TID BM  . folic acid  1 mg Oral Daily  . gabapentin  300 mg Oral TID  . heparin  5,000 Units Subcutaneous Q8H  . lisinopril  5 mg Oral Daily  . LORazepam  0-4 mg Oral Q6H   Followed by  . [START ON 08/12/2015] LORazepam  0-4 mg Oral Q12H  . multivitamin with minerals  1 tablet Oral Daily  . omega-3 acid ethyl esters  1 g Oral Daily  . pantoprazole  40 mg  Oral Daily  . pneumococcal 23 valent vaccine  0.5 mL Intramuscular Tomorrow-1000  . sodium chloride flush  3 mL Intravenous Q12H  . thiamine  100 mg Oral Daily   Or  . thiamine  100 mg Intravenous Daily  . ticagrelor  90 mg Oral BID   Continuous Infusions: . nitroGLYCERIN Stopped (08/10/15 0957)   PRN Meds:.sodium chloride, acetaminophen, LORazepam **OR** LORazepam, morphine injection, nitroGLYCERIN, ondansetron (ZOFRAN) IV, oxyCODONE-acetaminophen, sodium chloride flush  Assessment/Plan:  Principal Problem:   STEMI (ST elevation myocardial infarction) ([place]) Active Problems:   CAD (coronary artery disease)   HTN (hypertension)   Hyperlipidemia   Tobacco abuse   ST elevation (STEMI) myocardial infarction involving right coronary artery ([place])  50 year old male with previous RCA STEMI, stent thrombosis in the past, here with thrombotic occlusion of RCA status post plain old balloon angioplasty/thrombectomy with tirofiban.  STEMI, inferior/RCA  - Balloon angioplasty and thrombectomy only of RCA by Dr. Martinique with residual thrombus burden in the PDA  - Reloaded Brilinta, continue aspirin (both were on hold recently for an upcoming spinal injection). He understands not to discontinue these medications again.  - echocardiogram - 45% EF, anteroseptal wall hypokinesis  - I decreased his carvedilol from 12.5 mg down to 6.25 mg twice a day, blood pressures have been running a bit low.  - Cardiac rehabilitation  - Watch for any signs of mechanical failure.  - troponin peak 18, now 10  - tirofiban for PDA thrombus burden has been discontinued  Hypokalemia  - Mild, 3.6, replete  Essential hypertension  - Well controlled  Alcohol use  - Watch for any signs of withdrawal  - CIWA protocol ordered, Ativan given yesterday.  Hyperlipidemia  - Continue with statin  Known CAD  - Prior RCA intervention with complication of stent thrombosis in the past. Lifelong DAPT.  Tobacco use  -  cessation  - likely contributing to issues breathing as well.   Thrombocytosis  - likely acute phase reactant  Transfer to floor. Expected discharge 08/12/15.  Candee Furbish 08/11/2015, 9:43 AM

## 2015-08-11 NOTE — Progress Notes (Addendum)
VASCULAR LAB PRELIMINARY  PRELIMINARY  PRELIMINARY  PRELIMINARY  Carotid duplex completed.    Preliminary report: 1-39% ICA plaquing.  Vertebral artery flow is antegrade.  Alias Villagran, RVT 08/11/2015, 6:35 PM

## 2015-08-12 ENCOUNTER — Encounter (HOSPITAL_COMMUNITY): Payer: Self-pay | Admitting: Cardiology

## 2015-08-12 ENCOUNTER — Telehealth: Payer: Self-pay | Admitting: Cardiovascular Disease

## 2015-08-12 ENCOUNTER — Other Ambulatory Visit: Payer: Self-pay | Admitting: Physician Assistant

## 2015-08-12 LAB — POCT I-STAT, CHEM 8
BUN: 14 mg/dL (ref 6–20)
CALCIUM ION: 1.18 mmol/L (ref 1.12–1.23)
CHLORIDE: 102 mmol/L (ref 101–111)
Creatinine, Ser: 0.7 mg/dL (ref 0.61–1.24)
Glucose, Bld: 119 mg/dL — ABNORMAL HIGH (ref 65–99)
HCT: 50 % (ref 39.0–52.0)
HEMOGLOBIN: 17 g/dL (ref 13.0–17.0)
Potassium: 3.6 mmol/L (ref 3.5–5.1)
SODIUM: 139 mmol/L (ref 135–145)
TCO2: 21 mmol/L (ref 0–100)

## 2015-08-12 LAB — PATHOLOGIST SMEAR REVIEW

## 2015-08-12 LAB — POCT ACTIVATED CLOTTING TIME
ACTIVATED CLOTTING TIME: 384 s
Activated Clotting Time: 245 seconds

## 2015-08-12 MED ORDER — CARVEDILOL 6.25 MG PO TABS: 6.2500 mg | ORAL_TABLET | Freq: Two times a day (BID) | ORAL | Status: AC

## 2015-08-12 MED ORDER — ASPIRIN 81 MG PO CHEW: 81.0000 mg | CHEWABLE_TABLET | Freq: Every day | ORAL | Status: DC

## 2015-08-12 MED ORDER — CARVEDILOL 6.25 MG PO TABS: 6.2500 mg | ORAL_TABLET | Freq: Two times a day (BID) | ORAL | Status: DC

## 2015-08-12 MED ORDER — TICAGRELOR 90 MG PO TABS: 90.0000 mg | ORAL_TABLET | Freq: Two times a day (BID) | ORAL | Status: AC

## 2015-08-12 MED FILL — Verapamil HCl IV Soln 2.5 MG/ML: INTRAVENOUS | Qty: 2 | Status: AC

## 2015-08-12 NOTE — Telephone Encounter (Signed)
Returned pt call. Patient believes he left medications in hospital. He is at home. Has tried unsuccessfully to reach someone working in hospital to help him w/ this. Pt requests someone call him w/ this. I called and spoke to a gentleman w [facility] staff who would not provide his name, but assured me he would check into this. i requested they call the patient back w/ the outcome of the search.

## 2015-08-12 NOTE — Care Management Note (Signed)
Case Management Note  Patient Details  Name: Joshua Bird MRN: [id number] Date of Birth: 1966-01-22  Subjective/Objective:  Late Entry: Pt admitted for [facility]. Plan for home on Brilinta. Pt previously on Brilinta and was noncompliant with medications. Pt has Medicaid for [employer].                   Action/Plan: CM received referral to assist pt with taking medications. Pt has Medicaid for Insurance and meds range from $1.50 to $3.00. No needs from CM at this time.    Expected Discharge Date:                  Expected Discharge Plan:  Home/Self Care  In-House Referral:  NA  Discharge planning Services  CM Consult  Post Acute Care Choice:  NA Choice offered to:  NA  DME Arranged:  N/A DME Agency:  NA  HH Arranged:  NA HH Agency:  NA  Status of Service:  Completed, signed off  Medicare Important Message Given:    Date Medicare IM Given:    Medicare IM give by:    Date Additional Medicare IM Given:    Additional Medicare Important Message give by:     If discussed at [place] of Stay Meetings, dates discussed:    Additional Comments:  Bethena Roys, RN 08/12/2015, 1:53 PM

## 2015-08-12 NOTE — Discharge Instructions (Signed)

## 2015-08-12 NOTE — Plan of Care (Signed)
Problem: Safety: Goal: Ability to remain free from injury will improve Outcome: Progressing Patient instructed to stay in bed and to call RN/NT via number on white board or use call light and call front desk for assistance, all personal belongings within reach and call light/phone within reach, instructed patient that I will be putting his bed alarm on for the night and that it will alarm if he gets OOB without callin and he verbalized understanding, will continue to monitor.

## 2015-08-12 NOTE — Discharge Summary (Signed)
Discharge Summary    Patient ID: Joshua Bird,  MRN: [id number], DOB/AGE: Aug 05, 1965 50 y.o.  Admit date: 08/09/2015 Discharge date: 08/12/2015  Primary Care Provider: No PCP Per Patient Primary Cardiologist: Dr. Jimmie Molly [place]) [facility]  Discharge Diagnoses    Principal Problem:   STEMI (ST elevation myocardial infarction) [facility]) Active Problems:   CAD (coronary artery disease)   HTN (hypertension)   Hyperlipidemia   Tobacco abuse   ST elevation (STEMI) myocardial infarction involving right coronary artery ([facility])   Allergies No Known Allergies  Diagnostic Studies/Procedures     LHC: 08/09/2015 Procedures    Left Heart Cath and Coronary Angiography    Conclusion     Ost RPDA to RPDA lesion, 100% stenosed. The lesion was previously treated with a stent (unknown type) at an unknown time in past.  There is mild to moderate left ventricular systolic dysfunction.  Mid RCA lesion, 100% stenosed. Post intervention, there is a 0% residual stenosis. The lesion was previously treated with a stent (unknown type) one to two years ago.  1. 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 Moderate LV dysfunction 3. Successful POBA of the proximal to distal RCA.  4. Persistent occlusion of the PDA with thrombus.   Plan: DAPT indefinitely. He should never stop both ASA and P2Y12 together. Will continue Aggrastat for 18 hours. Cycle enzymes. Check Echo tomorrow. Risk factor modification.   Echo: 08/10/2015  Study Conclusions  - Left ventricle: The cavity size was normal. Wall thickness was  increased increased in a pattern of mild to moderate LVH.  Systolic function was mildly reduced. The estimated ejection  fraction was in the range of 45% to 50%. Moderate hypokinesis of  the anteroseptal and anterior myocardium. - Left atrium: The atrium was mildly dilated. _____________   History of Present Illness     Patient is a 50 yo M with h/o EtOH  abuse, HTN, HLD, and CAD s/p inferior STEMI and multiple previous interventions to his RCA (initial MI in 2014 at [facility], recurrent STEMI in 10/15 in setting of noncompliance, and last intervention Xience DES to RCA in 04/24/14 in setting of late stent thrombosis secondary to noncompliance) who presented to [facility] as an inferior STEMI. He stopped his ASA and Brilinta on 08/06/2015 for potential spinal injection, and presented to [facility] ED with chest pressure and SOB on 08/09/2015.  Hospital Course     Joshua Bird is a 50 yo male who presented to [facility] ED on 08/09/2015 with reports of sudden onset of chest pressure and dyspnea that started that evening, when he was walking to the fridge to get a beer. Of note he stopped taking his Brilinta and ASA on 08/06/2015 with plans to have a spinal injection done for chronic back pain. On EMS arrival EKG showed inferior ST elevations and he was taken emergently to the cath lab where the angiogram showed 100% thrombotic occlusion of the mid-RCA. Thrombectomy and POBA from the proximal to distal RCA was preformed. He was started on tirofiban and reloaded on Brilinta.   On 08/10/2015 he was noted to be stable post intervention, repeat 2D echo was performed which showed an EF of 45-50% with anteroseptal wall hypokinesis. Did report some minimal dsypnea, and CIWA protocol was started given his reports of drinking 6-8 beers a day. His troponin peaked at 18. On 08/11/2015 his coreg was decreased to 6.'25mg'$  BID given some soft bp readings. He was transferred to telemetry.  08/12/2015 he was noted to be ambulatory around the room and nurses station without complaints of chest pain or dyspnea. Labs noted to be stable, along with vital signs. He is to remain on lifelong DAPT given his complications. I have informed him of the need to follow up with his cardiologist in [place], Dr Jimmie Molly, and stressed the importance of NOT stopping his medications. Also strongly  encouraged smoking cessation, which he reports planning to stop.   Physical Exam: 08/12/2015 General: Well developed, well nourished, in no acute distress. Head: Normocephalic and atraumatic. Lungs: Clear to auscultation and percussion. Heart: Normal S1 and S2. No murmur, rubs or gallops.  Abdomen: soft, non-tender, positive bowel sounds. Extremities: No clubbing or cyanosis. No edema. Cath site radial, minimal bruising, good distal perfusion Neurologic: Alert and oriented x 3.  _____________  Discharge Vitals Blood pressure 109/74, pulse 97, temperature 98.2 F (36.8 C), temperature source Oral, resp. rate 20, height '5\' 5"'$  (1.651 m), weight 195 lb (88.451 kg), SpO2 96 %.  Filed Weights   08/09/15 2003 08/09/15 2030 08/12/15 0500  Weight: 191 lb 12.8 oz (87 kg) 191 lb 12.8 oz (87 kg) 195 lb (88.451 kg)    Labs & Radiologic Studies    CBC  Recent Labs  08/09/15 2255 08/10/15 0429  WBC 11.4* 12.4*  HGB 13.5 14.4  HCT 41.6 43.5  MCV 93.5 91.4  PLT 780* 277*   Basic Metabolic Panel  Recent Labs  08/09/15 1905 08/09/15 2255 08/10/15 0429  NA 138  --  135  K 3.6  --  3.6  CL 103  --  101  CO2 22  --  23  GLUCOSE 124*  --  143*  BUN 12  --  11  CREATININE 0.80 0.61 0.72  CALCIUM 9.0  --  9.0   Liver Function Tests  Recent Labs  08/09/15 1905  AST 36  ALT 46  ALKPHOS 94  BILITOT 0.7  PROT 7.3  ALBUMIN 4.2   Cardiac Enzymes  Recent Labs  08/09/15 1905  08/10/15 1932 08/11/15 0040 08/11/15 0656  CKTOTAL 119  --   --   --   --   CKMB 3.3  --   --   --   --   TROPONINI <0.03  < > 18.52* 15.71* 10.93*  < > = values in this interval not displayed. Hemoglobin A1C  Recent Labs  08/09/15 1905  HGBA1C 5.6   Fasting Lipid Panel  Recent Labs  08/09/15 1905  CHOL 151  HDL 47  LDLCALC 83  TRIG 104  CHOLHDL 3.2   Thyroid Function Tests  Recent Labs  08/10/15 0429  TSH 0.518   _____________  No results found. Disposition   Pt is being  discharged home today in good condition.  Follow-up Plans & Appointments   Follow up with Dr. Jimmie Molly in [place] within 2 weeks.   Discharge Instructions    AMB Referral to Cardiac Rehabilitation - Phase II    Complete by:  As directed   Diagnosis:  STEMI     Amb Referral to Cardiac Rehabilitation    Complete by:  As directed   To [place]  Diagnosis:   STEMI PTCA       Diet - low sodium heart healthy    Complete by:  As directed      Increase activity slowly    Complete by:  As directed            Discharge Medications   Current  Discharge Medication List    CONTINUE these medications which have CHANGED   Details  carvedilol (COREG) 6.25 MG tablet Take 1 tablet (6.25 mg total) by mouth 2 (two) times daily with a meal. Qty: 60 tablet, Refills: 11    ticagrelor (BRILINTA) 90 MG TABS tablet Take 1 tablet (90 mg total) by mouth 2 (two) times daily. Reported on 08/09/2015 Qty: 60 tablet, Refills: 11      CONTINUE these medications which have NOT CHANGED   Details  aspirin 81 MG tablet Take 81 mg by mouth daily.    atorvastatin (LIPITOR) 80 MG tablet Take 80 mg by mouth at bedtime.    gabapentin (NEURONTIN) 300 MG capsule Take 300 mg by mouth 3 (three) times daily.    lisinopril (PRINIVIL,ZESTRIL) 5 MG tablet Take 5 mg by mouth daily.    nitroGLYCERIN (NITROSTAT) 0.4 MG SL tablet Place 0.4 mg under the tongue every 5 (five) minutes as needed for chest pain. Reported on 08/09/2015      STOP taking these medications     clopidogrel (PLAVIX) 75 MG tablet      aspirin 81 MG chewable tablet      Omega-3 Fatty Acids (FISH OIL) 1000 MG CAPS          Aspirin prescribed at discharge?  Yes High Intensity Statin Prescribed? (Lipitor 40-'80mg'$  or Crestor 20-'40mg'$ ): Yes Beta Blocker Prescribed? Yes For EF <40%, was ACEI/ARB Prescribed? Yes ADP Receptor Inhibitor Prescribed? (i.e. Plavix etc.-Includes Medically Managed Patients): Yes For EF <40%, Aldosterone Inhibitor  Prescribed? No: Not indicated Was EF assessed during THIS hospitalization? Yes Was Cardiac Rehab II ordered? (Included Medically managed Patients): Yes   Outstanding Labs/Studies   None  Duration of Discharge Encounter   Greater than 30 minutes including physician time.  Signed, Kevaughn Ewing NP-C 08/12/2015, 1:44 PM

## 2015-08-12 NOTE — Progress Notes (Signed)
Report received via [place] in patient's room using SBAR format, reviewed orders, VS, tests and patient's general condition, assumed care of patient.

## 2015-08-12 NOTE — Progress Notes (Signed)
CARDIAC REHAB PHASE I   PRE:  Rate/Rhythm: 108 ST  BP:  Sitting: 105/77        SaO2: 97 RA  MODE:  Ambulation: 500 ft   POST:  Rate/Rhythm: 98 SR  BP:  Sitting: 96/78         SaO2: 100 RA  Pt ambulated 500 ft on RA, handheld assist, fairly steady gait, tolerated fair. Pt states he uses a cane at home, declined use of RW, pt used side rails in hallway to steady himself. Pt c/o significant DOE (quiestion if this is baseline), denies cp, dizziness, declined rest stop. VSS. Completed MI/PCI education.  Reviewed risk factors, tobacco/ETOH cessation, anti-platelet therapy, activity restrictions, ntg, medication compliance, exercise, heart healthy diet, sodium restrictions, and phase 2 cardiac rehab. Pt verbalized understanding, limited receptiveness to education. Pt states he drinks because he is depressed, has hx suicidal ideation (denies presently), declines more information regarding potential resources/treatment. Pt states he does not plan to quit smoking or drinking at this time. Pt agrees to phase 2 cardiac rehab referral, states he will not likely have transportation to attend, will send to [place]. Pt to recliner after walk, call bell within reach. Will follow-up tomorrow if pt does not discharge today.  [phone] Lenna Sciara, RN, BSN 08/12/2015 10:46 AM

## 2015-08-12 NOTE — Telephone Encounter (Signed)
New Message  Pt stated that after discharging from hospital- his medications were not given back to him- pt requested to speak w/ RN. Please call back and discuss.

## 2015-08-12 NOTE — Plan of Care (Signed)
Problem: Pain Managment: Goal: General experience of comfort will improve Outcome: Progressing Patient is able to tell location, duration, quality and radiation of pain using pain scale (0-10) and asks for pain medication appropriately, will continue to monitor.

## 2016-03-19 DIAGNOSIS — D696 Thrombocytopenia, unspecified: Secondary | ICD-10-CM | POA: Diagnosis not present

## 2016-03-19 DIAGNOSIS — F17210 Nicotine dependence, cigarettes, uncomplicated: Secondary | ICD-10-CM

## 2016-03-19 DIAGNOSIS — I252 Old myocardial infarction: Secondary | ICD-10-CM

## 2016-04-02 DIAGNOSIS — D473 Essential (hemorrhagic) thrombocythemia: Secondary | ICD-10-CM | POA: Diagnosis not present

## 2016-04-02 DIAGNOSIS — Z7982 Long term (current) use of aspirin: Secondary | ICD-10-CM

## 2016-04-12 IMAGING — CR DG CHEST 1V PORT
1 series · 1 of 1 positions shown · non-contrast
Comparison: 12/10/2013

CLINICAL DATA: Status post cardiac catheterization today. Mid chest
pain. No shortness of breath.

EXAM:
PORTABLE CHEST - 1 VIEW

[AP]
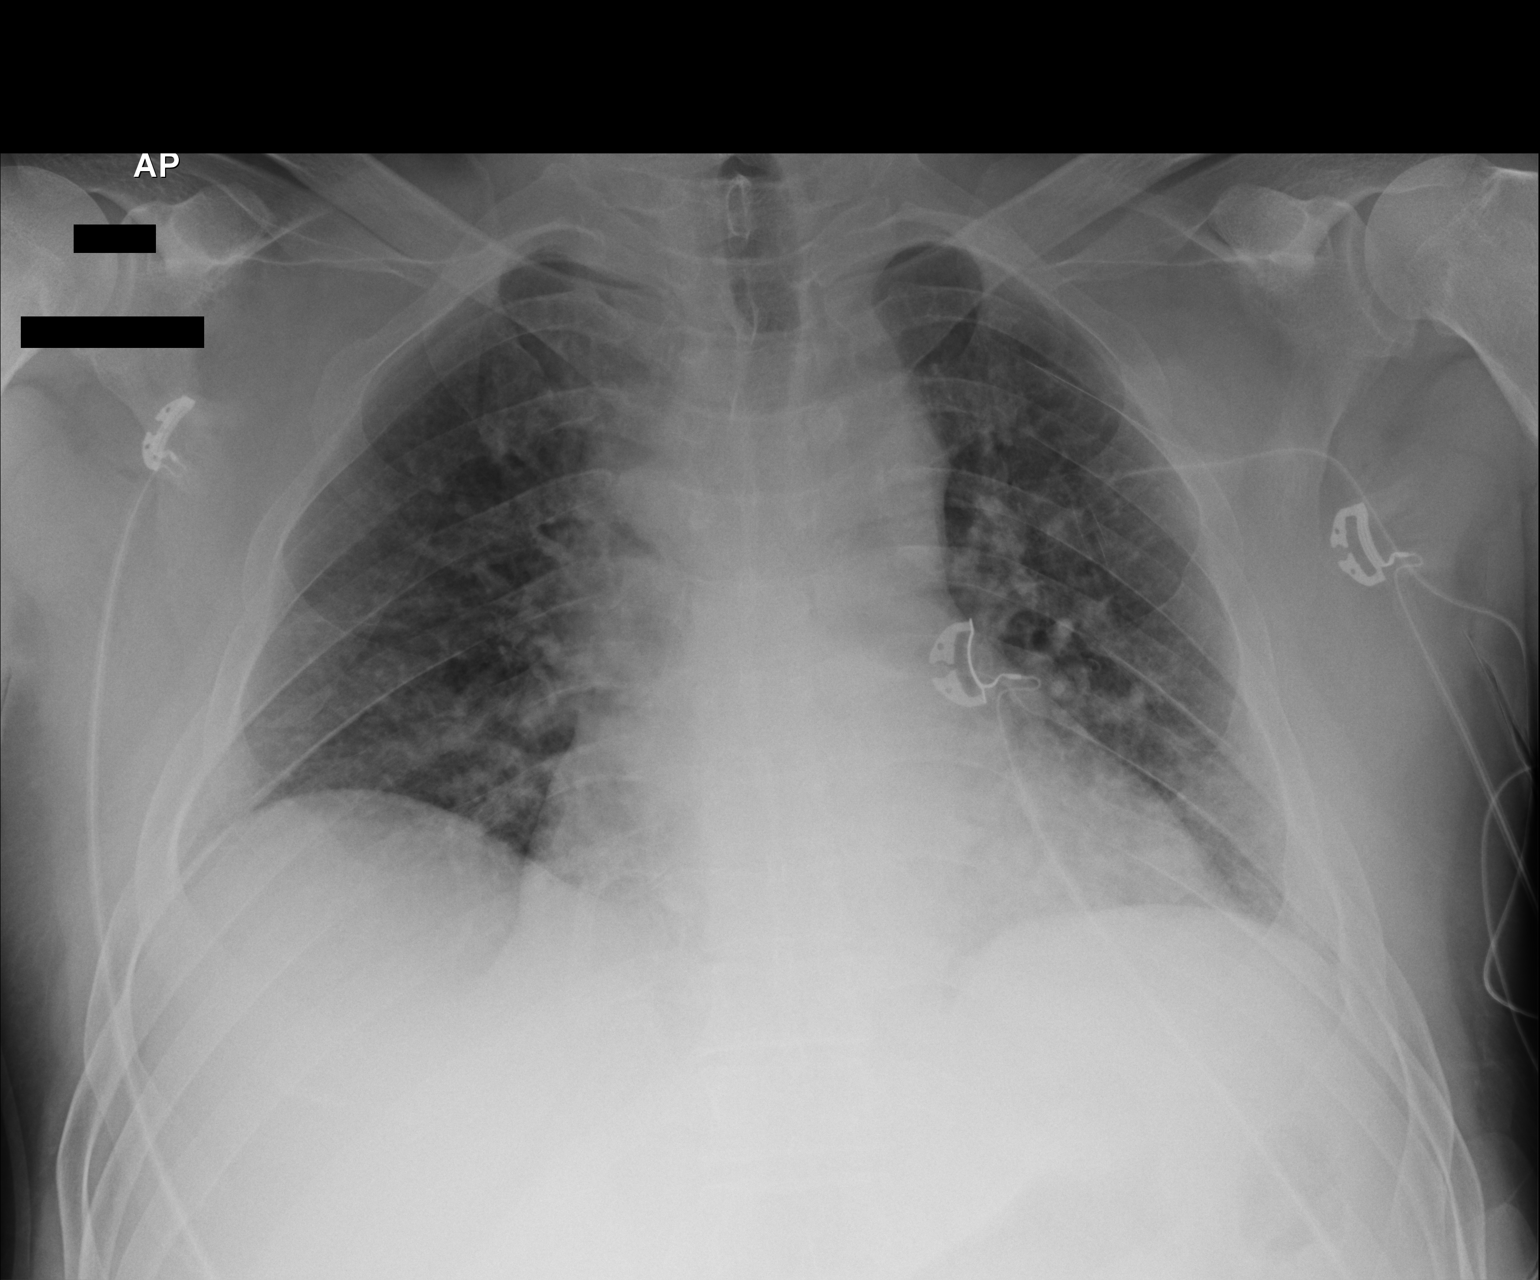

[1 of 1 positions shown; findings below may reference images not displayed]

FINDINGS: AP portable film is performed with shallow lung inflation which
accentuates the bronchovascular markings. The heart is mildly
enlarged. Mild prominence of the bronchovascular markings raises the
question of mild edema despite the technique. There are no focal
consolidations. No pleural effusions. No pneumothorax.
IMPRESSION: Cardiomegaly and mild edema.

## 2016-06-09 DIAGNOSIS — D473 Essential (hemorrhagic) thrombocythemia: Secondary | ICD-10-CM | POA: Diagnosis not present

## 2016-10-09 DIAGNOSIS — D473 Essential (hemorrhagic) thrombocythemia: Secondary | ICD-10-CM | POA: Diagnosis not present

## 2016-10-09 DIAGNOSIS — Z7982 Long term (current) use of aspirin: Secondary | ICD-10-CM | POA: Diagnosis not present

## 2017-04-14 ENCOUNTER — Ambulatory Visit (HOSPITAL_COMMUNITY): Payer: Self-pay | Admitting: Psychiatry

## 2017-04-20 DIAGNOSIS — D473 Essential (hemorrhagic) thrombocythemia: Secondary | ICD-10-CM | POA: Diagnosis not present

## 2017-04-20 DIAGNOSIS — Z7982 Long term (current) use of aspirin: Secondary | ICD-10-CM | POA: Diagnosis not present

## 2017-06-22 ENCOUNTER — Ambulatory Visit (HOSPITAL_COMMUNITY): Payer: Self-pay | Admitting: Psychiatry

## 2017-10-18 DIAGNOSIS — Z79899 Other long term (current) drug therapy: Secondary | ICD-10-CM

## 2017-10-18 DIAGNOSIS — Z7982 Long term (current) use of aspirin: Secondary | ICD-10-CM | POA: Diagnosis not present

## 2017-10-18 DIAGNOSIS — D473 Essential (hemorrhagic) thrombocythemia: Secondary | ICD-10-CM

## 2018-08-01 MED ORDER — GABAPENTIN 400 MG PO CAPS
800.00 | ORAL_CAPSULE | ORAL | Status: DC
Start: 2018-08-12 — End: 2018-08-01

## 2018-08-01 MED ORDER — SODIUM CHLORIDE FLUSH 0.9 % IV SOLN
5.00 | INTRAVENOUS | Status: DC
Start: 2018-08-12 — End: 2018-08-01

## 2018-08-01 MED ORDER — BISACODYL 10 MG RE SUPP
10.00 | RECTAL | Status: DC
Start: ? — End: 2018-08-01

## 2018-08-01 MED ORDER — TIZANIDINE HCL 4 MG PO TABS
4.00 | ORAL_TABLET | ORAL | Status: DC
Start: ? — End: 2018-08-01

## 2018-08-01 MED ORDER — HEPARIN SODIUM (PORCINE) 5000 UNIT/ML IJ SOLN
5000.00 | INTRAMUSCULAR | Status: DC
Start: 2018-08-03 — End: 2018-08-01

## 2018-08-01 MED ORDER — DICYCLOMINE HCL 10 MG PO CAPS
10.00 | ORAL_CAPSULE | ORAL | Status: DC
Start: ? — End: 2018-08-01

## 2018-08-01 MED ORDER — OXYCODONE HCL 5 MG PO TABS
15.00 | ORAL_TABLET | ORAL | Status: DC
Start: 2018-08-01 — End: 2018-08-01

## 2018-08-01 MED ORDER — SODIUM CHLORIDE FLUSH 0.9 % IV SOLN
5.00 | INTRAVENOUS | Status: DC
Start: ? — End: 2018-08-01

## 2018-08-01 MED ORDER — ALBUTEROL SULFATE HFA 108 (90 BASE) MCG/ACT IN AERS
2.00 | INHALATION_SPRAY | RESPIRATORY_TRACT | Status: DC
Start: ? — End: 2018-08-01

## 2018-08-01 MED ORDER — GENERIC EXTERNAL MEDICATION
3.38 | Status: DC
Start: 2018-08-03 — End: 2018-08-01

## 2018-08-01 MED ORDER — GENERIC EXTERNAL MEDICATION
Status: DC
Start: ? — End: 2018-08-01

## 2018-08-01 MED ORDER — LORAZEPAM 0.5 MG PO TABS
.50 | ORAL_TABLET | ORAL | Status: DC
Start: ? — End: 2018-08-01

## 2018-08-01 MED ORDER — LISINOPRIL 5 MG PO TABS
5.00 | ORAL_TABLET | ORAL | Status: DC
Start: 2018-08-13 — End: 2018-08-01

## 2018-08-01 MED ORDER — ACETAMINOPHEN 325 MG PO TABS
650.00 | ORAL_TABLET | ORAL | Status: DC
Start: ? — End: 2018-08-01

## 2018-08-01 MED ORDER — MORPHINE SULFATE 4 MG/ML IJ SOLN
4.00 | INTRAMUSCULAR | Status: DC
Start: ? — End: 2018-08-01

## 2018-08-01 MED ORDER — GENERIC EXTERNAL MEDICATION
6.00 | Status: DC
Start: 2018-08-01 — End: 2018-08-01

## 2018-08-01 MED ORDER — ONDANSETRON 4 MG PO TBDP
4.00 | ORAL_TABLET | ORAL | Status: DC
Start: ? — End: 2018-08-01

## 2018-08-01 MED ORDER — UMECLIDINIUM BROMIDE 62.5 MCG/INH IN AEPB
1.00 | INHALATION_SPRAY | RESPIRATORY_TRACT | Status: DC
Start: 2018-08-13 — End: 2018-08-01

## 2018-08-01 MED ORDER — ATORVASTATIN CALCIUM 40 MG PO TABS
80.00 | ORAL_TABLET | ORAL | Status: DC
Start: 2018-08-12 — End: 2018-08-01

## 2018-08-01 MED ORDER — Medication
1.00 | Status: DC
Start: 2018-08-13 — End: 2018-08-01

## 2018-08-01 MED ORDER — CARVEDILOL 12.5 MG PO TABS
12.50 | ORAL_TABLET | ORAL | Status: DC
Start: 2018-08-12 — End: 2018-08-01

## 2018-08-01 MED ORDER — NITROGLYCERIN 0.4 MG SL SUBL
0.40 | SUBLINGUAL_TABLET | SUBLINGUAL | Status: DC
Start: ? — End: 2018-08-01

## 2018-08-01 MED ORDER — OXYCODONE HCL 5 MG PO TABS
5.00 | ORAL_TABLET | ORAL | Status: DC
Start: ? — End: 2018-08-01

## 2018-08-01 MED ORDER — GENERIC EXTERNAL MEDICATION
1.25 | Status: DC
Start: 2018-08-01 — End: 2018-08-01

## 2018-08-02 MED ORDER — MORPHINE SULFATE ER 30 MG PO TBCR
30.00 | EXTENDED_RELEASE_TABLET | ORAL | Status: DC
Start: 2018-08-03 — End: 2018-08-02

## 2018-08-02 MED ORDER — GENERIC EXTERNAL MEDICATION
Status: DC
Start: ? — End: 2018-08-02

## 2018-08-02 MED ORDER — HYDROMORPHONE HCL 1 MG/ML IJ SOLN
2.00 | INTRAMUSCULAR | Status: DC
Start: ? — End: 2018-08-02

## 2018-08-02 MED ORDER — DEXAMETHASONE 4 MG PO TABS
4.00 | ORAL_TABLET | ORAL | Status: DC
Start: 2018-08-10 — End: 2018-08-02

## 2018-08-02 MED ORDER — OXYCODONE HCL 5 MG PO TABS
15.00 | ORAL_TABLET | ORAL | Status: DC
Start: ? — End: 2018-08-02

## 2018-08-02 MED ORDER — GUAIFENESIN-CODEINE 100-10 MG/5ML PO SOLN
10.00 | ORAL | Status: DC
Start: ? — End: 2018-08-02

## 2018-08-03 MED ORDER — GENERIC EXTERNAL MEDICATION
Status: DC
Start: ? — End: 2018-08-03

## 2018-08-04 MED ORDER — GENERIC EXTERNAL MEDICATION
45.00 | Status: DC
Start: 2018-08-05 — End: 2018-08-04

## 2018-08-04 MED ORDER — GENERIC EXTERNAL MEDICATION
Status: DC
Start: ? — End: 2018-08-04

## 2018-08-04 MED ORDER — HEPARIN SODIUM (PORCINE) 5000 UNIT/ML IJ SOLN
5000.00 | INTRAMUSCULAR | Status: DC
Start: 2018-08-12 — End: 2018-08-04

## 2018-08-04 MED ORDER — HEPARIN SODIUM LOCK FLUSH 100 UNIT/ML IV SOLN
500.00 | INTRAVENOUS | Status: DC
Start: 2018-08-15 — End: 2018-08-04

## 2018-08-05 MED ORDER — LIDOCAINE 4 % EX PTCH
1.00 | MEDICATED_PATCH | CUTANEOUS | Status: DC
Start: 2018-08-13 — End: 2018-08-05

## 2018-08-05 MED ORDER — DOCUSATE SODIUM 100 MG PO CAPS
100.00 | ORAL_CAPSULE | ORAL | Status: DC
Start: 2018-08-13 — End: 2018-08-05

## 2018-08-05 MED ORDER — GENERIC EXTERNAL MEDICATION
Status: DC
Start: ? — End: 2018-08-05

## 2018-08-08 MED ORDER — HEPARIN SODIUM LOCK FLUSH 100 UNIT/ML IV SOLN
500.00 | INTRAVENOUS | Status: DC
Start: ? — End: 2018-08-08

## 2018-08-08 MED ORDER — GENERIC EXTERNAL MEDICATION
Status: DC
Start: ? — End: 2018-08-08

## 2018-08-08 MED ORDER — MORPHINE SULFATE ER 30 MG PO TBCR
60.00 | EXTENDED_RELEASE_TABLET | ORAL | Status: DC
Start: 2018-08-12 — End: 2018-08-08

## 2018-08-09 MED ORDER — FLUCONAZOLE 50 MG PO TABS
100.00 | ORAL_TABLET | ORAL | Status: DC
Start: 2018-08-13 — End: 2018-08-09

## 2018-08-09 MED ORDER — GENERIC EXTERNAL MEDICATION
Status: DC
Start: ? — End: 2018-08-09

## 2018-08-10 MED ORDER — GENERIC EXTERNAL MEDICATION
750.00 | Status: DC
Start: 2018-08-10 — End: 2018-08-10

## 2018-08-10 MED ORDER — GENERIC EXTERNAL MEDICATION
150.00 | Status: DC
Start: 2018-08-10 — End: 2018-08-10

## 2018-08-10 MED ORDER — GENERIC EXTERNAL MEDICATION
Status: DC
Start: ? — End: 2018-08-10

## 2018-08-10 MED ORDER — GENERIC EXTERNAL MEDICATION
100.00 | Status: DC
Start: 2018-08-10 — End: 2018-08-10

## 2018-08-10 MED ORDER — GENERIC EXTERNAL MEDICATION
10.00 | Status: DC
Start: 2018-08-10 — End: 2018-08-10

## 2018-08-12 MED ORDER — OXYCODONE HCL 5 MG PO TABS
10.00 | ORAL_TABLET | ORAL | Status: DC
Start: ? — End: 2018-08-12

## 2018-08-12 MED ORDER — GENERIC EXTERNAL MEDICATION
Status: DC
Start: ? — End: 2018-08-12

## 2018-08-12 MED ORDER — DEXAMETHASONE 4 MG PO TABS
4.00 | ORAL_TABLET | ORAL | Status: DC
Start: 2018-08-12 — End: 2018-08-12

## 2018-08-16 DIAGNOSIS — D473 Essential (hemorrhagic) thrombocythemia: Secondary | ICD-10-CM

## 2018-08-31 DIAGNOSIS — C3490 Malignant neoplasm of unspecified part of unspecified bronchus or lung: Secondary | ICD-10-CM | POA: Diagnosis not present

## 2018-08-31 DIAGNOSIS — C7951 Secondary malignant neoplasm of bone: Secondary | ICD-10-CM | POA: Diagnosis not present

## 2018-09-01 DIAGNOSIS — Z0001 Encounter for general adult medical examination with abnormal findings: Secondary | ICD-10-CM

## 2018-09-20 DIAGNOSIS — C7951 Secondary malignant neoplasm of bone: Secondary | ICD-10-CM

## 2018-09-20 DIAGNOSIS — C3490 Malignant neoplasm of unspecified part of unspecified bronchus or lung: Secondary | ICD-10-CM | POA: Diagnosis not present

## 2018-10-12 DIAGNOSIS — C7951 Secondary malignant neoplasm of bone: Secondary | ICD-10-CM

## 2018-10-12 DIAGNOSIS — C3431 Malignant neoplasm of lower lobe, right bronchus or lung: Secondary | ICD-10-CM

## 2018-10-12 DIAGNOSIS — D473 Essential (hemorrhagic) thrombocythemia: Secondary | ICD-10-CM

## 2018-10-21 DIAGNOSIS — Z0001 Encounter for general adult medical examination with abnormal findings: Secondary | ICD-10-CM

## 2018-10-26 MED ORDER — ASPIRIN 325 MG PO TABS
325.00 | ORAL_TABLET | ORAL | Status: DC
Start: 2018-10-27 — End: 2018-10-26

## 2018-10-26 MED ORDER — GLUCOSE 40 % PO GEL
15.00 | ORAL | Status: DC
Start: ? — End: 2018-10-26

## 2018-10-26 MED ORDER — OXYCODONE-ACETAMINOPHEN 5-325 MG PO TABS
1.00 | ORAL_TABLET | ORAL | Status: DC
Start: ? — End: 2018-10-26

## 2018-10-26 MED ORDER — DEXTROSE 10 % IV SOLN
125.00 | INTRAVENOUS | Status: DC
Start: ? — End: 2018-10-26

## 2018-10-26 MED ORDER — GENERIC EXTERNAL MEDICATION
1.00 | Status: DC
Start: 2018-10-27 — End: 2018-10-26

## 2018-10-26 MED ORDER — PANTOPRAZOLE SODIUM 40 MG IV SOLR
40.00 | INTRAVENOUS | Status: DC
Start: 2018-10-28 — End: 2018-10-26

## 2018-10-26 MED ORDER — ATORVASTATIN CALCIUM 40 MG PO TABS
80.00 | ORAL_TABLET | ORAL | Status: DC
Start: 2018-10-27 — End: 2018-10-26

## 2018-10-26 MED ORDER — ASPIRIN EC 81 MG PO TBEC
81.00 | DELAYED_RELEASE_TABLET | ORAL | Status: DC
Start: 2018-10-28 — End: 2018-10-26

## 2018-10-26 MED ORDER — GLUCAGON HCL RDNA (DIAGNOSTIC) 1 MG IJ SOLR
1.00 | INTRAMUSCULAR | Status: DC
Start: ? — End: 2018-10-26

## 2018-10-26 MED ORDER — NITROGLYCERIN 0.4 MG SL SUBL
.40 | SUBLINGUAL_TABLET | SUBLINGUAL | Status: DC
Start: ? — End: 2018-10-26

## 2018-10-26 MED ORDER — GABAPENTIN 400 MG PO CAPS
400.00 | ORAL_CAPSULE | ORAL | Status: DC
Start: 2018-10-27 — End: 2018-10-26

## 2018-10-26 MED ORDER — INSULIN LISPRO 100 UNIT/ML ~~LOC~~ SOLN
0.00 | SUBCUTANEOUS | Status: DC
Start: 2018-10-27 — End: 2018-10-26

## 2018-10-26 MED ORDER — ACETAMINOPHEN 500 MG PO TABS
500.00 | ORAL_TABLET | ORAL | Status: DC
Start: ? — End: 2018-10-26

## 2018-10-26 MED ORDER — TIZANIDINE HCL 4 MG PO TABS
4.00 | ORAL_TABLET | ORAL | Status: DC
Start: ? — End: 2018-10-26

## 2018-10-26 MED ORDER — ALBUTEROL SULFATE HFA 108 (90 BASE) MCG/ACT IN AERS
2.00 | INHALATION_SPRAY | RESPIRATORY_TRACT | Status: DC
Start: ? — End: 2018-10-26

## 2018-10-27 MED ORDER — METHYLPREDNISOLONE SODIUM SUCC 125 MG IJ SOLR
80.00 | INTRAMUSCULAR | Status: DC
Start: 2018-10-27 — End: 2018-10-27

## 2018-10-27 MED ORDER — LORAZEPAM 2 MG/ML IJ SOLN
.50 | INTRAMUSCULAR | Status: DC
Start: ? — End: 2018-10-27

## 2018-10-27 MED ORDER — OXYCODONE HCL 5 MG PO TABS
7.50 | ORAL_TABLET | ORAL | Status: DC
Start: ? — End: 2018-10-27

## 2018-10-27 MED ORDER — MORPHINE SULFATE ER 30 MG PO TBCR
30.00 | EXTENDED_RELEASE_TABLET | ORAL | Status: DC
Start: 2018-10-27 — End: 2018-10-27

## 2018-10-27 MED ORDER — MELATONIN 3 MG PO TABS
6.00 | ORAL_TABLET | ORAL | Status: DC
Start: ? — End: 2018-10-27

## 2018-11-02 DIAGNOSIS — C7951 Secondary malignant neoplasm of bone: Secondary | ICD-10-CM | POA: Diagnosis not present

## 2018-11-02 DIAGNOSIS — C3431 Malignant neoplasm of lower lobe, right bronchus or lung: Secondary | ICD-10-CM

## 2018-11-02 DIAGNOSIS — D473 Essential (hemorrhagic) thrombocythemia: Secondary | ICD-10-CM

## 2018-11-17 DIAGNOSIS — C3431 Malignant neoplasm of lower lobe, right bronchus or lung: Secondary | ICD-10-CM

## 2018-11-17 DIAGNOSIS — C7951 Secondary malignant neoplasm of bone: Secondary | ICD-10-CM

## 2018-11-17 DIAGNOSIS — D473 Essential (hemorrhagic) thrombocythemia: Secondary | ICD-10-CM

## 2018-12-06 MED ORDER — BL TUSSIN CF 30-10-100 MG/5ML PO SYRP
0.50 | ORAL_SOLUTION | ORAL | Status: DC
Start: ? — End: 2018-12-06

## 2018-12-06 MED ORDER — GENTAK 0.3 % OP SOLN
1.00 | OPHTHALMIC | Status: DC
Start: 2018-12-07 — End: 2018-12-06

## 2018-12-06 MED ORDER — GENERIC EXTERNAL MEDICATION
Status: DC
Start: ? — End: 2018-12-06

## 2018-12-06 MED ORDER — QUINERVA 260 MG PO TABS
325.00 | ORAL_TABLET | ORAL | Status: DC
Start: ? — End: 2018-12-06

## 2018-12-06 MED ORDER — CVS KIDPANT BOYS X-LARGE MISC
80.00 | Status: DC
Start: 2018-12-06 — End: 2018-12-06

## 2018-12-06 MED ORDER — TROJAN ULTRA TEXTURE LUBRICATE MISC
6.25 | Status: DC
Start: 2018-12-06 — End: 2018-12-06

## 2018-12-06 MED ORDER — SUBDUE PLUS PO LIQD
75.00 | ORAL | Status: DC
Start: 2018-12-07 — End: 2018-12-06

## 2018-12-06 MED ORDER — Medication
60.00 | Status: DC
Start: 2018-12-07 — End: 2018-12-06

## 2018-12-06 MED ORDER — Medication
75.00 | Status: DC
Start: 2018-12-06 — End: 2018-12-06

## 2018-12-06 MED ORDER — POLYETHYLENE GLYCOL 3350 17 G PO PACK
17.00 | PACK | ORAL | Status: DC
Start: 2018-12-07 — End: 2018-12-06

## 2018-12-06 MED ORDER — Medication
500.00 | Status: DC
Start: ? — End: 2018-12-06

## 2018-12-06 MED ORDER — SPIRONOLACTONE 25 MG PO TABS
12.50 | ORAL_TABLET | ORAL | Status: DC
Start: 2018-12-07 — End: 2018-12-06

## 2018-12-06 MED ORDER — Medication
1.00 | Status: DC
Start: 2018-12-06 — End: 2018-12-06

## 2018-12-06 MED ORDER — Medication
15.00 | Status: DC
Start: ? — End: 2018-12-06

## 2018-12-06 MED ORDER — FP LECITHIN 1200 MG PO CAPS
1.00 | ORAL_CAPSULE | ORAL | Status: DC
Start: 2018-12-08 — End: 2018-12-06

## 2018-12-06 MED ORDER — PHENOL EX
40.00 | CUTANEOUS | Status: DC
Start: 2018-12-07 — End: 2018-12-06

## 2018-12-06 MED ORDER — EQUATE NICOTINE 4 MG MT GUM
4.00 | CHEWING_GUM | OROMUCOSAL | Status: DC
Start: ? — End: 2018-12-06

## 2018-12-06 MED ORDER — Medication
1.00 | Status: DC
Start: 2018-12-07 — End: 2018-12-06

## 2018-12-06 MED ORDER — CALCIUM-VITAMIN D
600.00 | Status: DC
Start: 2018-12-06 — End: 2018-12-06

## 2018-12-06 MED ORDER — SENNOSIDES-DOCUSATE SODIUM 8.6-50 MG PO TABS
1.00 | ORAL_TABLET | ORAL | Status: DC
Start: ? — End: 2018-12-06

## 2018-12-06 MED ORDER — Medication
Status: DC
Start: 2018-12-06 — End: 2018-12-06

## 2018-12-06 MED ORDER — EAR PAIN RELIEF HOMEOPATHIC OT
10.00 | OTIC | Status: DC
Start: ? — End: 2018-12-06

## 2018-12-06 MED ORDER — Medication
800.00 | Status: DC
Start: 2018-12-06 — End: 2018-12-06

## 2018-12-06 MED ORDER — NEXTERONE IV
81.00 | INTRAVENOUS | Status: DC
Start: 2018-12-07 — End: 2018-12-06

## 2018-12-06 MED ORDER — Medication
4.00 | Status: DC
Start: ? — End: 2018-12-06

## 2018-12-06 MED ORDER — CVS EAR DROPS OT
40.00 | OTIC | Status: DC
Start: 2018-12-06 — End: 2018-12-06

## 2018-12-15 MED ORDER — REPAGLINIDE 2 MG PO TABS
60.00 | ORAL_TABLET | ORAL | Status: DC
Start: 2018-12-21 — End: 2018-12-15

## 2018-12-15 MED ORDER — INFLUENZA VAC SPLIT QUAD 0.5 ML IM SUSY
0.50 | PREFILLED_SYRINGE | INTRAMUSCULAR | Status: DC
Start: ? — End: 2018-12-15

## 2018-12-15 MED ORDER — SIMETHICONE 80 MG PO CHEW
80.00 | CHEWABLE_TABLET | ORAL | Status: DC
Start: ? — End: 2018-12-15

## 2018-12-15 MED ORDER — ONDANSETRON HCL 4 MG/2ML IJ SOLN
4.00 | INTRAMUSCULAR | Status: DC
Start: ? — End: 2018-12-15

## 2018-12-15 MED ORDER — LORAZEPAM 1 MG PO TABS
2.00 | ORAL_TABLET | ORAL | Status: DC
Start: ? — End: 2018-12-15

## 2018-12-15 MED ORDER — OXYCODONE HCL 5 MG PO TABS
15.00 | ORAL_TABLET | ORAL | Status: DC
Start: ? — End: 2018-12-15

## 2018-12-15 MED ORDER — GENERIC EXTERNAL MEDICATION
Status: DC
Start: ? — End: 2018-12-15

## 2018-12-15 MED ORDER — GUAIFENESIN-DM 100-10 MG/5ML PO SYRP
10.00 | ORAL_SOLUTION | ORAL | Status: DC
Start: ? — End: 2018-12-15

## 2018-12-15 MED ORDER — FOLIC ACID 1 MG PO TABS
1.00 | ORAL_TABLET | ORAL | Status: DC
Start: 2018-12-22 — End: 2018-12-15

## 2018-12-15 MED ORDER — TIZANIDINE HCL 4 MG PO TABS
4.00 | ORAL_TABLET | ORAL | Status: DC
Start: ? — End: 2018-12-15

## 2018-12-15 MED ORDER — GENERIC EXTERNAL MEDICATION
100.00 | Status: DC
Start: 2018-12-22 — End: 2018-12-15

## 2018-12-15 MED ORDER — SENNOSIDES-DOCUSATE SODIUM 8.6-50 MG PO TABS
1.00 | ORAL_TABLET | ORAL | Status: DC
Start: ? — End: 2018-12-15

## 2018-12-15 MED ORDER — GENERIC EXTERNAL MEDICATION
1.00 | Status: DC
Start: 2018-12-22 — End: 2018-12-15

## 2018-12-15 MED ORDER — SPIRONOLACTONE 25 MG PO TABS
12.50 | ORAL_TABLET | ORAL | Status: DC
Start: 2018-12-22 — End: 2018-12-15

## 2018-12-15 MED ORDER — QUINTABS PO TABS
1.00 | ORAL_TABLET | ORAL | Status: DC
Start: 2018-12-22 — End: 2018-12-15

## 2018-12-15 MED ORDER — NITROGLYCERIN 0.4 MG SL SUBL
0.40 | SUBLINGUAL_TABLET | SUBLINGUAL | Status: DC
Start: ? — End: 2018-12-15

## 2018-12-15 MED ORDER — ATORVASTATIN CALCIUM 40 MG PO TABS
80.00 | ORAL_TABLET | ORAL | Status: DC
Start: 2018-12-21 — End: 2018-12-15

## 2018-12-15 MED ORDER — REPAGLINIDE 2 MG PO TABS
30.00 | ORAL_TABLET | ORAL | Status: DC
Start: 2018-12-21 — End: 2018-12-15

## 2018-12-15 MED ORDER — HYDROMORPHONE HCL 1 MG/ML IJ SOLN
0.10 | INTRAMUSCULAR | Status: DC
Start: ? — End: 2018-12-15

## 2018-12-15 MED ORDER — ENOXAPARIN SODIUM 40 MG/0.4ML ~~LOC~~ SOLN
40.00 | SUBCUTANEOUS | Status: DC
Start: 2018-12-15 — End: 2018-12-15

## 2018-12-15 MED ORDER — FUROSEMIDE 40 MG PO TABS
40.00 | ORAL_TABLET | ORAL | Status: DC
Start: 2018-12-22 — End: 2018-12-15

## 2018-12-15 MED ORDER — Medication
Status: DC
Start: 2018-12-21 — End: 2018-12-15

## 2018-12-15 MED ORDER — GENERIC EXTERNAL MEDICATION
2.00 | Status: DC
Start: ? — End: 2018-12-15

## 2018-12-15 MED ORDER — PNEUMOCOCCAL VAC POLYVALENT 25 MCG/0.5ML IJ INJ
0.50 | INJECTION | INTRAMUSCULAR | Status: DC
Start: ? — End: 2018-12-15

## 2018-12-15 MED ORDER — GABAPENTIN 400 MG PO CAPS
800.00 | ORAL_CAPSULE | ORAL | Status: DC
Start: 2018-12-21 — End: 2018-12-15

## 2018-12-15 MED ORDER — ASPIRIN EC 81 MG PO TBEC
81.00 | DELAYED_RELEASE_TABLET | ORAL | Status: DC
Start: 2018-12-22 — End: 2018-12-15

## 2018-12-15 MED ORDER — CLOPIDOGREL BISULFATE 75 MG PO TABS
75.00 | ORAL_TABLET | ORAL | Status: DC
Start: 2018-12-22 — End: 2018-12-15

## 2018-12-15 MED ORDER — CETIRIZINE HCL 10 MG PO TABS
10.00 | ORAL_TABLET | ORAL | Status: DC
Start: 2018-12-22 — End: 2018-12-15

## 2018-12-15 MED ORDER — CARVEDILOL 3.125 MG PO TABS
6.25 | ORAL_TABLET | ORAL | Status: DC
Start: 2018-12-15 — End: 2018-12-15

## 2018-12-15 MED ORDER — POLYETHYLENE GLYCOL 3350 17 G PO PACK
17.00 | PACK | ORAL | Status: DC
Start: 2018-12-22 — End: 2018-12-15

## 2018-12-19 MED ORDER — METOPROLOL TARTRATE 25 MG PO TABS
25.00 | ORAL_TABLET | ORAL | Status: DC
Start: 2018-12-21 — End: 2018-12-19

## 2018-12-19 MED ORDER — HYDROMORPHONE HCL 1 MG/ML IJ SOLN
0.20 | INTRAMUSCULAR | Status: DC
Start: ? — End: 2018-12-19

## 2018-12-19 MED ORDER — ALPRAZOLAM 0.25 MG PO TABS
0.25 | ORAL_TABLET | ORAL | Status: DC
Start: ? — End: 2018-12-19

## 2018-12-19 MED ORDER — GENERIC EXTERNAL MEDICATION
Status: DC
Start: ? — End: 2018-12-19

## 2018-12-19 MED ORDER — DILTIAZEM HCL 30 MG PO TABS
30.00 | ORAL_TABLET | ORAL | Status: DC
Start: 2018-12-19 — End: 2018-12-19

## 2018-12-19 MED ORDER — GUAIFENESIN ER 600 MG PO TB12
600.00 | ORAL_TABLET | ORAL | Status: DC
Start: 2018-12-21 — End: 2018-12-19

## 2018-12-20 MED ORDER — FENTANYL CITRATE (PF) 50 MCG/ML IJ SOLN
INTRAMUSCULAR | Status: DC
Start: ? — End: 2018-12-20

## 2018-12-20 MED ORDER — DILTIAZEM HCL 30 MG PO TABS
30.00 | ORAL_TABLET | ORAL | Status: DC
Start: ? — End: 2018-12-20

## 2018-12-20 MED ORDER — GENERIC EXTERNAL MEDICATION
Status: DC
Start: ? — End: 2018-12-20

## 2018-12-21 MED ORDER — GENERIC EXTERNAL MEDICATION
Status: DC
Start: ? — End: 2018-12-21

## 2018-12-23 DIAGNOSIS — C3431 Malignant neoplasm of lower lobe, right bronchus or lung: Secondary | ICD-10-CM | POA: Diagnosis not present

## 2018-12-23 DIAGNOSIS — C7951 Secondary malignant neoplasm of bone: Secondary | ICD-10-CM

## 2019-01-22 DEATH — deceased
